# Patient Record
Sex: Female | Born: 1980
Health system: Southern US, Community
[De-identification: ages and names within clinical notes are randomized; demographics above are authoritative.]

## PROBLEM LIST (undated history)

## (undated) DIAGNOSIS — G4730 Sleep apnea, unspecified: Secondary | ICD-10-CM

## (undated) DIAGNOSIS — E119 Type 2 diabetes mellitus without complications: Secondary | ICD-10-CM

## (undated) DIAGNOSIS — F329 Major depressive disorder, single episode, unspecified: Secondary | ICD-10-CM

## (undated) DIAGNOSIS — J45909 Unspecified asthma, uncomplicated: Secondary | ICD-10-CM

## (undated) DIAGNOSIS — F319 Bipolar disorder, unspecified: Secondary | ICD-10-CM

## (undated) DIAGNOSIS — F32A Depression, unspecified: Secondary | ICD-10-CM

## (undated) DIAGNOSIS — F909 Attention-deficit hyperactivity disorder, unspecified type: Secondary | ICD-10-CM

## (undated) DIAGNOSIS — I10 Essential (primary) hypertension: Secondary | ICD-10-CM

## (undated) DIAGNOSIS — M48061 Spinal stenosis, lumbar region without neurogenic claudication: Secondary | ICD-10-CM

## (undated) DIAGNOSIS — F419 Anxiety disorder, unspecified: Secondary | ICD-10-CM

## (undated) DIAGNOSIS — G5602 Carpal tunnel syndrome, left upper limb: Secondary | ICD-10-CM

## (undated) HISTORY — PX: WISDOM TOOTH EXTRACTION: SHX21

## (undated) HISTORY — PX: LAPAROSCOPIC GASTRIC SLEEVE RESECTION: SHX5895

## (undated) HISTORY — DX: Attention-deficit hyperactivity disorder, unspecified type: F90.9

## (undated) HISTORY — DX: Essential (primary) hypertension: I10

---

## 2006-01-31 ENCOUNTER — Other Ambulatory Visit: Admission: RE | Admit: 2006-01-31 | Discharge: 2006-01-31 | Payer: Self-pay | Admitting: Obstetrics and Gynecology

## 2007-08-21 ENCOUNTER — Ambulatory Visit (HOSPITAL_COMMUNITY): Admission: RE | Admit: 2007-08-21 | Discharge: 2007-08-21 | Payer: Self-pay | Admitting: Family Medicine

## 2007-08-21 ENCOUNTER — Ambulatory Visit: Payer: Self-pay | Admitting: Vascular Surgery

## 2009-08-12 HISTORY — PX: LAPAROSCOPIC GASTRIC BANDING: SHX1100

## 2011-07-11 DIAGNOSIS — Z6841 Body Mass Index (BMI) 40.0 and over, adult: Secondary | ICD-10-CM | POA: Diagnosis present

## 2012-04-16 DIAGNOSIS — Z9884 Bariatric surgery status: Secondary | ICD-10-CM

## 2012-06-16 ENCOUNTER — Other Ambulatory Visit: Payer: Self-pay | Admitting: Obstetrics & Gynecology

## 2012-08-07 ENCOUNTER — Ambulatory Visit: Admit: 2012-08-07 | Payer: Self-pay | Admitting: Obstetrics & Gynecology

## 2012-08-07 SURGERY — LAPAROSCOPY, DIAGNOSTIC
Anesthesia: Choice

## 2013-06-15 LAB — OB RESULTS CONSOLE GC/CHLAMYDIA
Chlamydia: NEGATIVE
Gonorrhea: NEGATIVE

## 2013-06-15 LAB — OB RESULTS CONSOLE HEPATITIS B SURFACE ANTIGEN: HEP B S AG: NEGATIVE

## 2013-06-15 LAB — OB RESULTS CONSOLE HIV ANTIBODY (ROUTINE TESTING): HIV: NONREACTIVE

## 2013-06-15 LAB — OB RESULTS CONSOLE RUBELLA ANTIBODY, IGM: Rubella: IMMUNE

## 2013-06-15 LAB — OB RESULTS CONSOLE RPR: RPR: NONREACTIVE

## 2013-06-24 ENCOUNTER — Ambulatory Visit: Payer: Self-pay | Admitting: Obstetrics & Gynecology

## 2013-08-12 NOTE — L&D Delivery Note (Signed)
Delivery Note  Called to BS w pt urge to push, vtx at introitus and infant delivered w 1 push   At 8:30 PM a non-viable female was delivered via Vaginal, Spontaneous Delivery (Presentation: ;  ).  APGAR: 0, 0; weight: pending  Placenta status: intact, Spontaneous.  Cord:  with the following complications: .  Cord pH: n/a   Anesthesia: None  Episiotomy: None Lacerations: None Suture Repair: n/a Est. Blood Loss (mL): 150cc   Mom to postpartum.  [facility]  tx to 3rd floor, plan d/c tmrw  Emotional support provided Grieving appropriately    Emily HippoShelley M Yelitza [name] 12/03/2013, 9:04 PM

## 2013-11-10 ENCOUNTER — Encounter: Payer: Self-pay | Admitting: Obstetrics and Gynecology

## 2013-12-03 ENCOUNTER — Inpatient Hospital Stay (HOSPITAL_COMMUNITY): Payer: BC Managed Care – PPO | Admitting: Anesthesiology

## 2013-12-03 ENCOUNTER — Encounter (HOSPITAL_COMMUNITY): Payer: Self-pay | Admitting: *Deleted

## 2013-12-03 ENCOUNTER — Inpatient Hospital Stay (HOSPITAL_COMMUNITY)
Admission: AD | Admit: 2013-12-03 | Discharge: 2013-12-04 | DRG: 775 | Disposition: A | Discharge status: Home / Self Care | Payer: BC Managed Care – PPO | Source: Ambulatory Visit | Attending: Obstetrics and Gynecology | Admitting: Obstetrics and Gynecology

## 2013-12-03 ENCOUNTER — Inpatient Hospital Stay (HOSPITAL_COMMUNITY): Payer: BC Managed Care – PPO

## 2013-12-03 ENCOUNTER — Encounter (HOSPITAL_COMMUNITY): Payer: BC Managed Care – PPO | Admitting: Anesthesiology

## 2013-12-03 DIAGNOSIS — O99344 Other mental disorders complicating childbirth: Secondary | ICD-10-CM | POA: Diagnosis present

## 2013-12-03 DIAGNOSIS — Z8759 Personal history of other complications of pregnancy, childbirth and the puerperium: Secondary | ICD-10-CM | POA: Diagnosis present

## 2013-12-03 DIAGNOSIS — Z6841 Body Mass Index (BMI) 40.0 and over, adult: Secondary | ICD-10-CM | POA: Diagnosis present

## 2013-12-03 DIAGNOSIS — F329 Major depressive disorder, single episode, unspecified: Secondary | ICD-10-CM | POA: Diagnosis present

## 2013-12-03 DIAGNOSIS — F3289 Other specified depressive episodes: Secondary | ICD-10-CM | POA: Diagnosis present

## 2013-12-03 DIAGNOSIS — Z9884 Bariatric surgery status: Secondary | ICD-10-CM

## 2013-12-03 DIAGNOSIS — Z8659 Personal history of other mental and behavioral disorders: Secondary | ICD-10-CM

## 2013-12-03 DIAGNOSIS — O99214 Obesity complicating childbirth: Secondary | ICD-10-CM

## 2013-12-03 DIAGNOSIS — IMO0002 Reserved for concepts with insufficient information to code with codable children: Secondary | ICD-10-CM | POA: Diagnosis present

## 2013-12-03 DIAGNOSIS — O364XX0 Maternal care for intrauterine death, not applicable or unspecified: Principal | ICD-10-CM | POA: Diagnosis present

## 2013-12-03 DIAGNOSIS — E669 Obesity, unspecified: Secondary | ICD-10-CM | POA: Diagnosis present

## 2013-12-03 LAB — CBC WITH DIFFERENTIAL/PLATELET
BASOS PCT: 0 % (ref 0–1)
Basophils Absolute: 0 10*3/uL (ref 0.0–0.1)
Eosinophils Absolute: 0.2 10*3/uL (ref 0.0–0.7)
Eosinophils Relative: 1 % (ref 0–5)
HCT: 34.9 % — ABNORMAL LOW (ref 36.0–46.0)
HEMOGLOBIN: 12 g/dL (ref 12.0–15.0)
Lymphocytes Relative: 15 % (ref 12–46)
Lymphs Abs: 2.5 10*3/uL (ref 0.7–4.0)
MCH: 30.9 pg (ref 26.0–34.0)
MCHC: 34.4 g/dL (ref 30.0–36.0)
MCV: 89.9 fL (ref 78.0–100.0)
Monocytes Absolute: 1.1 10*3/uL — ABNORMAL HIGH (ref 0.1–1.0)
Monocytes Relative: 6 % (ref 3–12)
NEUTROS PCT: 78 % — AB (ref 43–77)
Neutro Abs: 13.3 10*3/uL — ABNORMAL HIGH (ref 1.7–7.7)
Platelets: 249 10*3/uL (ref 150–400)
RBC: 3.88 MIL/uL (ref 3.87–5.11)
RDW: 13 % (ref 11.5–15.5)
WBC: 17.2 10*3/uL — ABNORMAL HIGH (ref 4.0–10.5)

## 2013-12-03 LAB — RAPID URINE DRUG SCREEN, HOSP PERFORMED
Amphetamines: NOT DETECTED
BENZODIAZEPINES: NOT DETECTED
Barbiturates: NOT DETECTED
COCAINE: NOT DETECTED
Opiates: NOT DETECTED
TETRAHYDROCANNABINOL: NOT DETECTED

## 2013-12-03 LAB — COMPREHENSIVE METABOLIC PANEL
ALK PHOS: 66 U/L (ref 39–117)
ALT: 14 U/L (ref 0–35)
AST: 15 U/L (ref 0–37)
Albumin: 3.1 g/dL — ABNORMAL LOW (ref 3.5–5.2)
BUN: 8 mg/dL (ref 6–23)
CHLORIDE: 101 meq/L (ref 96–112)
CO2: 22 mEq/L (ref 19–32)
Calcium: 9.3 mg/dL (ref 8.4–10.5)
Creatinine, Ser: 0.62 mg/dL (ref 0.50–1.10)
GFR calc Af Amer: >90 mL/min (ref >90)
GFR calc non Af Amer: >90 mL/min (ref >90)
Glucose, Bld: 82 mg/dL (ref 70–99)
POTASSIUM: 3.7 meq/L (ref 3.7–5.3)
Sodium: 137 mEq/L (ref 137–147)
Total Bilirubin: 0.2 mg/dL — ABNORMAL LOW (ref 0.3–1.2)
Total Protein: 6.7 g/dL (ref 6.0–8.3)

## 2013-12-03 LAB — LACTATE DEHYDROGENASE: LDH: 158 U/L (ref 94–250)

## 2013-12-03 LAB — PROTEIN / CREATININE RATIO, URINE
Creatinine, Urine: 59.32 mg/dL
PROTEIN CREATININE RATIO: 0.09 (ref 0.00–0.15)
Total Protein, Urine: 5.3 mg/dL

## 2013-12-03 LAB — WET PREP, GENITAL
CLUE CELLS WET PREP: NONE SEEN
Trich, Wet Prep: NONE SEEN
YEAST WET PREP: NONE SEEN

## 2013-12-03 LAB — TYPE AND SCREEN
ABO/RH(D): O POS
Antibody Screen: NEGATIVE

## 2013-12-03 LAB — ABO/RH: ABO/RH(D): O POS

## 2013-12-03 LAB — URIC ACID: Uric Acid, Serum: 3.9 mg/dL (ref 2.4–7.0)

## 2013-12-03 MED ORDER — FLUOXETINE HCL 20 MG PO CAPS
40.0000 mg | ORAL_CAPSULE | Freq: Every day | ORAL | Status: DC
  Filled 2013-12-03: qty 2

## 2013-12-03 MED ORDER — OXYTOCIN 40 UNITS IN LACTATED RINGERS INFUSION - SIMPLE MED
62.5000 mL/h | INTRAVENOUS | Status: DC
  Filled 2013-12-03: qty 1000

## 2013-12-03 MED ORDER — EPHEDRINE 5 MG/ML INJ
10.0000 mg | INTRAVENOUS | Status: DC | PRN
  Filled 2013-12-03: qty 2
  Filled 2013-12-03: qty 4

## 2013-12-03 MED ORDER — BENZOCAINE-MENTHOL 20-0.5 % EX AERO: 1.0000 "application " | INHALATION_SPRAY | CUTANEOUS | Status: DC | PRN

## 2013-12-03 MED ORDER — LANOLIN HYDROUS EX OINT: TOPICAL_OINTMENT | CUTANEOUS | Status: DC | PRN

## 2013-12-03 MED ORDER — CITRIC ACID-SODIUM CITRATE 334-500 MG/5ML PO SOLN: 30.0000 mL | ORAL | Status: DC | PRN

## 2013-12-03 MED ORDER — PHENYLEPHRINE 40 MCG/ML (10ML) SYRINGE FOR IV PUSH (FOR BLOOD PRESSURE SUPPORT)
80.0000 ug | PREFILLED_SYRINGE | INTRAVENOUS | Status: DC | PRN
  Filled 2013-12-03: qty 2

## 2013-12-03 MED ORDER — LACTATED RINGERS IV SOLN: 500.0000 mL | Freq: Once | INTRAVENOUS | Status: DC

## 2013-12-03 MED ORDER — BUTORPHANOL TARTRATE 1 MG/ML IJ SOLN
2.0000 mg | INTRAMUSCULAR | Status: DC | PRN
  Administered 2013-12-03 (×2): 2 mg via INTRAVENOUS
  Filled 2013-12-03 (×2): qty 2

## 2013-12-03 MED ORDER — MISOPROSTOL 200 MCG PO TABS
50.0000 ug | ORAL_TABLET | ORAL | Status: DC | PRN
  Administered 2013-12-03: 50 ug via VAGINAL
  Filled 2013-12-03 (×2): qty 0.5

## 2013-12-03 MED ORDER — IBUPROFEN 600 MG PO TABS
600.0000 mg | ORAL_TABLET | Freq: Four times a day (QID) | ORAL | Status: DC
  Administered 2013-12-04 (×3): 600 mg via ORAL
  Filled 2013-12-03 (×3): qty 1

## 2013-12-03 MED ORDER — MEDROXYPROGESTERONE ACETATE 150 MG/ML IM SUSP: 150.0000 mg | INTRAMUSCULAR | Status: DC | PRN

## 2013-12-03 MED ORDER — EPHEDRINE 5 MG/ML INJ
10.0000 mg | INTRAVENOUS | Status: DC | PRN
  Filled 2013-12-03: qty 2

## 2013-12-03 MED ORDER — LACTATED RINGERS IV SOLN
INTRAVENOUS | Status: DC
  Administered 2013-12-03: 13:00:00 via INTRAVENOUS

## 2013-12-03 MED ORDER — ZOLPIDEM TARTRATE 5 MG PO TABS: 5.0000 mg | ORAL_TABLET | Freq: Every evening | ORAL | Status: DC | PRN

## 2013-12-03 MED ORDER — OXYTOCIN BOLUS FROM INFUSION
500.0000 mL | INTRAVENOUS | Status: DC
  Administered 2013-12-03: 500 mL via INTRAVENOUS

## 2013-12-03 MED ORDER — BUTORPHANOL TARTRATE 1 MG/ML IJ SOLN: 1.0000 mg | INTRAMUSCULAR | Status: DC | PRN

## 2013-12-03 MED ORDER — PHENYLEPHRINE 40 MCG/ML (10ML) SYRINGE FOR IV PUSH (FOR BLOOD PRESSURE SUPPORT)
80.0000 ug | PREFILLED_SYRINGE | INTRAVENOUS | Status: DC | PRN
  Filled 2013-12-03: qty 10
  Filled 2013-12-03: qty 2

## 2013-12-03 MED ORDER — LIDOCAINE HCL (PF) 1 % IJ SOLN
30.0000 mL | INTRAMUSCULAR | Status: DC | PRN
  Filled 2013-12-03: qty 30

## 2013-12-03 MED ORDER — IBUPROFEN 600 MG PO TABS: 600.0000 mg | ORAL_TABLET | Freq: Four times a day (QID) | ORAL | Status: DC | PRN

## 2013-12-03 MED ORDER — DIPHENHYDRAMINE HCL 25 MG PO CAPS: 25.0000 mg | ORAL_CAPSULE | Freq: Four times a day (QID) | ORAL | Status: DC | PRN

## 2013-12-03 MED ORDER — SENNOSIDES-DOCUSATE SODIUM 8.6-50 MG PO TABS
2.0000 | ORAL_TABLET | ORAL | Status: DC
  Administered 2013-12-04: 2 via ORAL
  Filled 2013-12-03: qty 2

## 2013-12-03 MED ORDER — MEASLES, MUMPS & RUBELLA VAC ~~LOC~~ INJ
0.5000 mL | INJECTION | Freq: Once | SUBCUTANEOUS | Status: DC
  Filled 2013-12-03: qty 0.5

## 2013-12-03 MED ORDER — ACETAMINOPHEN 325 MG PO TABS: 650.0000 mg | ORAL_TABLET | ORAL | Status: DC | PRN

## 2013-12-03 MED ORDER — PRENATAL MULTIVITAMIN CH: 1.0000 | ORAL_TABLET | Freq: Every day | ORAL | Status: DC

## 2013-12-03 MED ORDER — BUTORPHANOL TARTRATE 1 MG/ML IJ SOLN
INTRAMUSCULAR | Status: AC
  Filled 2013-12-03: qty 2

## 2013-12-03 MED ORDER — TETANUS-DIPHTH-ACELL PERTUSSIS 5-2.5-18.5 LF-MCG/0.5 IM SUSP: 0.5000 mL | Freq: Once | INTRAMUSCULAR | Status: DC

## 2013-12-03 MED ORDER — ONDANSETRON HCL 4 MG/2ML IJ SOLN
4.0000 mg | Freq: Four times a day (QID) | INTRAMUSCULAR | Status: DC | PRN
Start: 2013-12-03 — End: 2013-12-03

## 2013-12-03 MED ORDER — OXYCODONE-ACETAMINOPHEN 5-325 MG PO TABS: 1.0000 | ORAL_TABLET | ORAL | Status: DC | PRN

## 2013-12-03 MED ORDER — DIBUCAINE 1 % RE OINT: 1.0000 "application " | TOPICAL_OINTMENT | RECTAL | Status: DC | PRN

## 2013-12-03 MED ORDER — SIMETHICONE 80 MG PO CHEW: 80.0000 mg | CHEWABLE_TABLET | ORAL | Status: DC | PRN

## 2013-12-03 MED ORDER — ONDANSETRON HCL 4 MG/2ML IJ SOLN: 4.0000 mg | INTRAMUSCULAR | Status: DC | PRN

## 2013-12-03 MED ORDER — FLEET ENEMA 7-19 GM/118ML RE ENEM: 1.0000 | ENEMA | Freq: Every day | RECTAL | Status: DC | PRN

## 2013-12-03 MED ORDER — FENTANYL 2.5 MCG/ML BUPIVACAINE 1/10 % EPIDURAL INFUSION (WH - ANES)
14.0000 mL/h | INTRAMUSCULAR | Status: DC | PRN
  Filled 2013-12-03: qty 125

## 2013-12-03 MED ORDER — DIPHENHYDRAMINE HCL 50 MG/ML IJ SOLN: 12.5000 mg | INTRAMUSCULAR | Status: DC | PRN

## 2013-12-03 MED ORDER — LACTATED RINGERS IV SOLN: 500.0000 mL | INTRAVENOUS | Status: DC | PRN

## 2013-12-03 MED ORDER — TERBUTALINE SULFATE 1 MG/ML IJ SOLN: 0.2500 mg | Freq: Once | INTRAMUSCULAR | Status: DC | PRN

## 2013-12-03 MED ORDER — BISACODYL 10 MG RE SUPP: 10.0000 mg | Freq: Every day | RECTAL | Status: DC | PRN

## 2013-12-03 MED ORDER — WITCH HAZEL-GLYCERIN EX PADS: 1.0000 "application " | MEDICATED_PAD | CUTANEOUS | Status: DC | PRN

## 2013-12-03 MED ORDER — BUPROPION HCL ER (XL) 300 MG PO TB24
450.0000 mg | ORAL_TABLET | Freq: Every day | ORAL | Status: DC
  Filled 2013-12-03: qty 1

## 2013-12-03 MED ORDER — ONDANSETRON HCL 4 MG PO TABS: 4.0000 mg | ORAL_TABLET | ORAL | Status: DC | PRN

## 2013-12-03 NOTE — MAU Note (Signed)
Bedside US in progress

## 2013-12-03 NOTE — Progress Notes (Signed)
12/03/13 1700  Clinical Encounter Type  Visited With Patient and family together (mom [name])  Visit Type Follow-up;Spiritual support;Social support  Referral From Chaplain  Spiritual Encounters  Spiritual Needs Emotional;Grief support   Followed up for continued spiritual and emotional support.  [name] and her mom report that they have a very supportive relationship and note that they have processed a number of family losses together.  They are at the beginning end of trying to absorb and process the unexpected death of pt's baby.  Per pt, she has good support from her psychiatrist, with whom she has an appointment set up for 6pm on Tuesday so that bereavement support is already in place.  Family is aware of ongoing chaplain availability, but please page as needs arise.  Thank you.  [address]Chaplain Lurlene Ronda [place], [place] [phone]

## 2013-12-03 NOTE — H&P (Signed)
[name] is a 33 y.o. female, G1P0 at 30.0 weeks, presenting for decreased fetal movement since last night and cramping.  Denies VB, UCs, recent fever, resp or GI c/o's, UTI or PIH s/s.   Patient Active Problem List   Diagnosis Date Noted  . IUFD (intrauterine fetal death) 12/03/2013  . Severe obesity (BMI >= 40) 12/03/2013  . Hx of laparoscopic gastric banding 12/03/2013  . H/O: depression 12/03/2013    History of present pregnancy: Patient entered care at 6 weeks.   EDC of 02/11/2014 was established by LMP   Anatomy scan:  22 weeks, limited with normal findings and an posterior placenta.  Anatomy scan completed at 26 weeks Additional US evaluations:  none.   Significant prenatal events:  none   Last evaluation:  11/26/2013 at [redacted] weeks GA  OB History   Grav Para Term Preterm Abortions TAB SAB Ect Mult Living   1              Past Medical History  Diagnosis Date  . Obesity    Past Surgical History  Procedure Laterality Date  . Laparoscopic gastric banding     Family History: family history is not on file. Social History:  reports that she has never smoked. She does not have any smokeless tobacco history on file. She reports that she does not drink alcohol or use illicit drugs.   ROS: see HPI above, all other systems are negative   No Known Allergies   Blood pressure 131/76, pulse 106, temperature 98.5 F (36.9 C), temperature source Oral, resp. rate 20, height 5\' 9"  (1.753 m), weight 307 lb 12.8 oz (139.617 kg).  Chest clear Heart RRR without murmur Abd gravid, NT Pelvic: 1-2 cm / 50 / -2 Ext: wnl   Prenatal labs: ABO, Rh:  O+ Antibody:   neg Rubella:   Immune RPR:   Neg HBsAg:   Neg HIV:   Neg GBS:  not collected Sickle cell/Hgb electrophoresis:  Normal study Pap:  Normal 07/20/14 GC:  Neg Chlamydia:  Neg Genetic screenings:  1st trimester and Quad normal Glucola:  126 Other:  TSH 2015 normal      Assessment IUFD at 7552w0d Obesity Labs  collected Hx of anxiety, Depression and ADHD   Plan: Admit to L&D per consult with Dr Su Hiltoberts Cyotoec 50mg  PV q 4 x 4 doses Provide supportive care. Chaplin to come PRN   Gray BernhardtJennifer OxleyCNM, MSN 12/03/2013, 1:22 PM

## 2013-12-03 NOTE — Progress Notes (Signed)
Patient ID: [name], female   DOB: 27-Jan-1981, 33 y.o.   MRN: [id number] [name] is a 33 y.o. G1P0 at 5186w0d admitted for IUFD  Subjective: Breathing w ctx, now requests epidural, just now SROM sm amt fluid, pt's mom supportive at bs, grieving appropriately   Objective: BP 140/70  Pulse 103  Temp(Src) 98.7 F (37.1 C) (Oral)  Resp 20  Ht 5\' 9"  (1.753 m)  Wt 307 lb 12.8 oz (139.617 kg)  BMI 45.43 kg/m2     FHT:  N/a  UC:   toco 2-3   SVE:   Dilation: 3 Effacement (%): Thick Station: -3 Exam by:: lee  ve deferred at present   Results for orders placed during the hospital encounter of 12/03/13 (from the past 24 hour(s))  PROTEIN / CREATININE RATIO, URINE     Status: None   Collection Time    12/03/13 10:55 AM      Result Value Ref Range   Creatinine, Urine 59.32     Total Protein, Urine 5.3     PROTEIN CREATININE RATIO 0.09  0.00 - 0.15  URINE RAPID DRUG SCREEN (HOSP PERFORMED)     Status: None   Collection Time    12/03/13 10:55 AM      Result Value Ref Range   Opiates NONE DETECTED  NONE DETECTED   Cocaine NONE DETECTED  NONE DETECTED   Benzodiazepines NONE DETECTED  NONE DETECTED   Amphetamines NONE DETECTED  NONE DETECTED   Tetrahydrocannabinol NONE DETECTED  NONE DETECTED   Barbiturates NONE DETECTED  NONE DETECTED  CBC WITH DIFFERENTIAL     Status: Abnormal   Collection Time    12/03/13  1:10 PM      Result Value Ref Range   WBC 17.2 (*) 4.0 - 10.5 K/uL   RBC 3.88  3.87 - 5.11 MIL/uL   Hemoglobin 12.0  12.0 - 15.0 g/dL   HCT 40.934.9 (*) 81.136.0 - 91.446.0 %   MCV 89.9  78.0 - 100.0 fL   MCH 30.9  26.0 - 34.0 pg   MCHC 34.4  30.0 - 36.0 g/dL   RDW 78.213.0  95.611.5 - 21.315.5 %   Platelets 249  150 - 400 K/uL   Neutrophils Relative % 78 (*) 43 - 77 %   Neutro Abs 13.3 (*) 1.7 - 7.7 K/uL   Lymphocytes Relative 15  12 - 46 %   Lymphs Abs 2.5  0.7 - 4.0 K/uL   Monocytes Relative 6  3 - 12 %   Monocytes Absolute 1.1 (*) 0.1 - 1.0 K/uL   Eosinophils Relative 1  0 - 5  %   Eosinophils Absolute 0.2  0.0 - 0.7 K/uL   Basophils Relative 0  0 - 1 %   Basophils Absolute 0.0  0.0 - 0.1 K/uL  COMPREHENSIVE METABOLIC PANEL     Status: Abnormal   Collection Time    12/03/13  1:10 PM      Result Value Ref Range   Sodium 137  137 - 147 mEq/L   Potassium 3.7  3.7 - 5.3 mEq/L   Chloride 101  96 - 112 mEq/L   CO2 22  19 - 32 mEq/L   Glucose, Bld 82  70 - 99 mg/dL   BUN 8  6 - 23 mg/dL   Creatinine, Ser 0.860.62  0.50 - 1.10 mg/dL   Calcium 9.3  8.4 - 57.810.5 mg/dL   Total Protein 6.7  6.0 - 8.3 g/dL  Albumin 3.1 (*) 3.5 - 5.2 g/dL   AST 15  0 - 37 U/L   ALT 14  0 - 35 U/L   Alkaline Phosphatase 66  39 - 117 U/L   Total Bilirubin 0.2 (*) 0.3 - 1.2 mg/dL   GFR calc non Af Amer >90  >90 mL/min   GFR calc Af Amer >90  >90 mL/min  URIC ACID     Status: None   Collection Time    12/03/13  1:10 PM      Result Value Ref Range   Uric Acid, Serum 3.9  2.4 - 7.0 mg/dL  LACTATE DEHYDROGENASE     Status: None   Collection Time    12/03/13  1:10 PM      Result Value Ref Range   LDH 158  94 - 250 U/L  ABO/RH     Status: None   Collection Time    12/03/13  1:10 PM      Result Value Ref Range   ABO/RH(D) O POS    TYPE AND SCREEN     Status: None   Collection Time    12/03/13  3:00 PM      Result Value Ref Range   ABO/RH(D) O POS     Antibody Screen NEG     Sample Expiration 12/06/2013    WET PREP, GENITAL     Status: Abnormal   Collection Time    12/03/13  3:23 PM      Result Value Ref Range   Yeast Wet Prep HPF POC NONE SEEN  NONE SEEN   Trich, Wet Prep NONE SEEN  NONE SEEN   Clue Cells Wet Prep HPF POC NONE SEEN  NONE SEEN   WBC, Wet Prep HPF POC FEW (*) NONE SEEN     Assessment / Plan:  Labor: Progressing normally Preeclampsia:  no s/s, all PIH labs normal  Fetal Wellbeing:  n/a Pain Control:  ready for epidural  Anticipated MOD:  NSVD  IOL for IUFD at 30wks Leukocytosis, afebrile Progressing after rcv'd 2 dose cytotec,  Will recheck after  comfortable, augment pitocin PRN  Emotional support    Update physician PRN   Malissa HippoShelley M Alizandra Loh 12/03/2013, 8:13 PM

## 2013-12-03 NOTE — Progress Notes (Signed)
  Subjective: Pt reports more intensity with cramping.  Family at bedside and supportive.  Objective: BP 113/56  Pulse 102  Temp(Src) 98.4 F (36.9 C) (Oral)  Resp 18  Ht 5\' 9"  (1.753 m)  Wt 307 lb 12.8 oz (139.617 kg)  BMI 45.43 kg/m2      UC:   cramping  SVE:   Dilation: 1.5 Effacement (%): 50 Station: -2 Exam by:: Vonne Mcdanel, cnm   Induction of labor due to fetal demise,  progressing well on pitocin  Labor: Dose of Cytotec placed at 1514;  Will re-evaluate POC at 1914  Preeclampsia: BP WNL  Fetal Wellbeing: n/a  Pain Control: Labor support without medications  I/D: n/a  Anticipated MOD: NSVD    Melady Chow 11/13/2013, 7:23 AM

## 2013-12-03 NOTE — MAU Note (Signed)
Lower abd cramping since last night, denies bleeding or LOF.  No FM noted during the night.

## 2013-12-03 NOTE — MAU Note (Signed)
Unable to find FHR with EFM or doppler, CNM informed, U/S to be ordered.

## 2013-12-03 NOTE — Anesthesia Preprocedure Evaluation (Deleted)
Anesthesia Evaluation  Patient identified by MRN, date of birth, ID band Patient awake    Reviewed: Allergy & Precautions, H&P , Patient's Chart, lab work & pertinent test results  Airway       Dental   Pulmonary          Cardiovascular negative cardio ROS      Neuro/Psych    GI/Hepatic negative GI ROS, Neg liver ROS,   Endo/Other  Morbid obesity  Renal/GU negative Renal ROS     Musculoskeletal   Abdominal   Peds  Hematology negative hematology ROS (+)   Anesthesia Other Findings   Reproductive/Obstetrics IUFD                          Anesthesia Physical Anesthesia Plan  ASA: III  Anesthesia Plan: Epidural   Post-op Pain Management:    Induction:   Airway Management Planned:   Additional Equipment:   Intra-op Plan:   Post-operative Plan:   Informed Consent: I have reviewed the patients History and Physical, chart, labs and discussed the procedure including the risks, benefits and alternatives for the proposed anesthesia with the patient or authorized representative who has indicated his/her understanding and acceptance.     Plan Discussed with:   Anesthesia Plan Comments:         Anesthesia Quick Evaluation

## 2013-12-03 NOTE — MAU Note (Signed)
J. Oxley CNM in discussing U/S results with pt & her family member - no cardiac activity seen on U/S.

## 2013-12-03 NOTE — Progress Notes (Signed)
Ms Barry DienesOwens is still very much in shock.  She has her mother and a close friend in the room with her.  They are still processing and she is not really able to take in much information about what to expect through the delivery and in the time after delivery.  She named her baby Felicity Pellegrinied after her father who died in 2012.  FOB is not involved at this time.    Chaplains are available for support 24 hours/day.  Please page for support.  [employer] CorporationChaplain Katy Gowri Suchan Pager, [phone] 1:40 PM   12/03/13 1300  Clinical Encounter Type  Visited With Patient and family together  Visit Type Spiritual support  Referral From Nurse  Consult/Referral To Chaplain  Spiritual Encounters  Spiritual Needs Grief support;Emotional  Stress Factors  Patient Stress Factors Loss

## 2013-12-04 LAB — CBC
HCT: 32.3 % — ABNORMAL LOW (ref 36.0–46.0)
Hemoglobin: 11.1 g/dL — ABNORMAL LOW (ref 12.0–15.0)
MCH: 31.1 pg (ref 26.0–34.0)
MCHC: 34.4 g/dL (ref 30.0–36.0)
MCV: 90.5 fL (ref 78.0–100.0)
PLATELETS: 262 10*3/uL (ref 150–400)
RBC: 3.57 MIL/uL — ABNORMAL LOW (ref 3.87–5.11)
RDW: 12.9 % (ref 11.5–15.5)
WBC: 22.1 10*3/uL — ABNORMAL HIGH (ref 4.0–10.5)

## 2013-12-04 LAB — GC/CHLAMYDIA PROBE AMP
CT PROBE, AMP APTIMA: NEGATIVE
GC Probe RNA: NEGATIVE

## 2013-12-04 MED ORDER — IBUPROFEN 600 MG PO TABS: 600.0000 mg | ORAL_TABLET | Freq: Four times a day (QID) | ORAL | Status: DC

## 2013-12-04 NOTE — Discharge Summary (Signed)
Vaginal Delivery Discharge Summary  [name]  DOB:    1981-04-17 MRN:    [id number] CSN:    [id number]  Date of admission:                  December 05, 2013  Date of discharge:                   12/04/13  Procedures this admission: IOL for IUFD at 30 weeks  Date of Delivery: Dec 05, 2013 by Sanda Klein  Newborn Data:  Non- viable female  Birth Weight: 3 lb 7.7 oz (1580 g) APGAR: 0, 0  Home with [facility], autopsy per pt request. Name: "Ted" Circumcision Plan: n/a  History of Present Illness:  Ms. [name] is a 33 y.o. female, G1P0100, who presents at [redacted]w[redacted]d weeks gestation. The patient has been followed at the [facility] and Gynecology division of [facility]. She was admitted induction of labor for IUFD. Her pregnancy has been complicated by:   Patient Active Problem List   Diagnosis Date Noted  . IUFD (intrauterine fetal death) 12/05/2013  . Severe obesity (BMI >= 40) 12-05-2013  . Hx of laparoscopic gastric banding December 05, 2013  . H/O: depression 12/05/2013     Hospital course:  The patient was admitted for IOL for IUFD.   Her labor was not complicated. She proceeded to have a vaginal delivery of a non-viable infant. Her delivery was not complicated. Her postpartum course was not complicated.  She was discharged to home on postpartum day 1 grieving appropriately  Feeding:  n/a  Contraception:  unable to discuss at this time  Discharge hemoglobin:  Hemoglobin  Date Value Ref Range Status  12/04/2013 11.1* 12.0 - 15.0 g/dL Final     HCT  Date Value Ref Range Status  12/04/2013 32.3* 36.0 - 46.0 % Final    Discharge Physical Exam:   General: alert, cooperative and tearful Lochia: appropriate Uterine Fundus: firm Incision: n/a DVT Evaluation: No evidence of DVT seen on physical exam. Negative Homan's sign.  Intrapartum Procedures: spontaneous vaginal delivery of a non-viable 30 week fetus Postpartum Procedures:  none Complications-Operative and Postpartum: none  Discharge Diagnoses: Spontaneous abortion  Discharge Information:  Activity:           pelvic rest Diet:                routine Medications: PNV and Ibuprofen Condition:      stable Instructions:   Postpartum Care After Vaginal Delivery  After you deliver your newborn (postpartum period), the usual stay in the hospital is 24 72 hours. If there were problems with your labor or delivery, or if you have other medical problems, you might be in the hospital longer.  While you are in the hospital, you will receive help and instructions on how to care for yourself and your newborn during the postpartum period.  While you are in the hospital:  Be sure to tell your nurses if you have pain or discomfort, as well as where you feel the pain and what makes the pain worse.  If you had an incision made near your vagina (episiotomy) or if you had some tearing during delivery, the nurses may put ice packs on your episiotomy or tear. The ice packs may help to reduce the pain and swelling.  If you are breastfeeding, you may feel uncomfortable contractions of your uterus for a couple of weeks. This is normal. The contractions help your uterus get back  to normal size.  It is normal to have some bleeding after delivery.  For the first 1 3 days after delivery, the flow is red and the amount may be similar to a period.  It is common for the flow to start and stop.  In the first few days, you may pass some small clots. Let your nurses know if you begin to pass large clots or your flow increases.  Do not  flush blood clots down the toilet before having the nurse look at them.  During the next 3 10 days after delivery, your flow should become more watery and pink or brown-tinged in color.  Ten to fourteen days after delivery, your flow should be a small amount of yellowish-white discharge.  The amount of your flow will decrease over the first few weeks  after delivery. Your flow may stop in 6 8 weeks. Most women have had their flow stop by 12 weeks after delivery.  You should change your sanitary pads frequently.  Wash your hands thoroughly with soap and water for at least 20 seconds after changing pads, using the toilet, or before holding or feeding your newborn.  You should feel like you need to empty your bladder within the first 6 8 hours after delivery.  In case you become weak, lightheaded, or faint, call your nurse before you get out of bed for the first time and before you take a shower for the first time.  Within the first few days after delivery, your breasts may begin to feel tender and full. This is called engorgement. Breast tenderness usually goes away within 48 72 hours after engorgement occurs. You may also notice milk leaking from your breasts. If you are not breastfeeding, do not stimulate your breasts. Breast stimulation can make your breasts produce more milk.  Spending as much time as possible with your newborn is very important. During this time, you and your newborn can feel close and get to know each other. Having your newborn stay in your room (rooming in) will help to strengthen the bond with your newborn. It will give you time to get to know your newborn and become comfortable caring for your newborn.  Your hormones change after delivery. Sometimes the hormone changes can temporarily cause you to feel sad or tearful. These feelings should not last more than a few days. If these feelings last longer than that, you should talk to your caregiver.  If desired, talk to your caregiver about methods of family planning or contraception.  Talk to your caregiver about immunizations. Your caregiver may want you to have the following immunizations before leaving the hospital:  Tetanus, diphtheria, and pertussis (Tdap) or tetanus and diphtheria (Td) immunization. It is very important that you and your family (including  grandparents) or others caring for your newborn are up-to-date with the Tdap or Td immunizations. The Tdap or Td immunization can help protect your newborn from getting ill.  Rubella immunization.  Varicella (chickenpox) immunization.  Influenza immunization. You should receive this annual immunization if you did not receive the immunization during your pregnancy. Document Released: 05/26/2007 Document Revised: 04/22/2012 Document Reviewed: 03/25/2012 [facility] Patient Information 2014 [place].   Postpartum Depression and Baby Blues  The postpartum period begins right after the birth of a baby. During this time, there is often a great amount of joy and excitement. It is also a time of considerable changes in the life of the parent(s). Regardless of how many times a mother gives birth,  each child brings new challenges and dynamics to the family. It is not unusual to have feelings of excitement accompanied by confusing shifts in moods, emotions, and thoughts. All mothers are at risk of developing postpartum depression or the "baby blues." These mood changes can occur right after giving birth, or they may occur many months after giving birth. The baby blues or postpartum depression can be mild or severe. Additionally, postpartum depression can resolve rather quickly, or it can be a long-term condition. CAUSES Elevated hormones and their rapid decline are thought to be a main cause of postpartum depression and the baby blues. There are a number of hormones that radically change during and after pregnancy. Estrogen and progesterone usually decrease immediately after delivering your baby. The level of thyroid hormone and various cortisol steroids also rapidly drop. Other factors that play a major role in these changes include major life events and genetics.  RISK FACTORS If you have any of the following risks for the baby blues or postpartum depression, know what symptoms to watch out for during  the postpartum period. Risk factors that may increase the likelihood of getting the baby blues or postpartum depression include:  Havinga personal or family history of depression.  Having depression while being pregnant.  Having premenstrual or oral contraceptive-associated mood issues.  Having exceptional life stress.  Having marital conflict.  Lacking a social support network.  Having a baby with special needs.  Having health problems such as diabetes. SYMPTOMS Baby blues symptoms include:  Brief fluctuations in mood, such as going from extreme happiness to sadness.  Decreased concentration.  Difficulty sleeping.  Crying spells, tearfulness.  Irritability.  Anxiety. Postpartum depression symptoms typically begin within the first month after giving birth. These symptoms include:  Difficulty sleeping or excessive sleepiness.  Marked weight loss.  Agitation.  Feelings of worthlessness.  Lack of interest in activity or food. Postpartum psychosis is a very concerning condition and can be dangerous. Fortunately, it is rare. Displaying any of the following symptoms is cause for immediate medical attention. Postpartum psychosis symptoms include:  Hallucinations and delusions.  Bizarre or disorganized behavior.  Confusion or disorientation. DIAGNOSIS  A diagnosis is made by an evaluation of your symptoms. There are no medical or lab tests that lead to a diagnosis, but there are various questionnaires that a caregiver may use to identify those with the baby blues, postpartum depression, or psychosis. Often times, a screening tool called the [place] Postnatal Depression Scale is used to diagnose depression in the postpartum period.  TREATMENT The baby blues usually goes away on its own in 1 to 2 weeks. Social support is often all that is needed. You should be encouraged to get adequate sleep and rest. Occasionally, you may be given medicines to help you sleep.   Postpartum depression requires treatment as it can last several months or longer if it is not treated. Treatment may include individual or group therapy, medicine, or both to address any social, physiological, and psychological factors that may play a role in the depression. Regular exercise, a healthy diet, rest, and social support may also be strongly recommended.  Postpartum psychosis is more serious and needs treatment right away. Hospitalization is often needed. HOME CARE INSTRUCTIONS  Get as much rest as you can. Nap when the baby sleeps.  Exercise regularly. Some women find yoga and walking to be beneficial.  Eat a balanced and nourishing diet.  Do little things that you enjoy. Have a cup of tea, take a  bubble bath, read your favorite magazine, or listen to your favorite music.  Avoid alcohol.  Ask for help with household chores, cooking, grocery shopping, or running errands as needed. Do not try to do everything.  Talk to people close to you about how you are feeling. Get support from your partner, family members, friends, or other new moms.  Try to stay positive in how you think. Think about the things you are grateful for.  Do not spend a lot of time alone.  Only take medicine as directed by your caregiver.  Keep all your postpartum appointments.  Let your caregiver know if you have any concerns. SEEK MEDICAL CARE IF: You are having a reaction or problems with your medicine. SEEK IMMEDIATE MEDICAL CARE IF:  You have suicidal feelings.  You feel you may harm the baby or someone else. Document Released: 05/02/2004 Document Revised: 10/21/2011 Document Reviewed: 06/04/2011 [facility] Patient Information 2014 [place], [place].   Discharge to: home  Follow-up Information   Follow up with [facility]. Schedule an appointment as soon as possible for a visit in 2 weeks. (Call with any questions or concerns.)    Specialty:  Obstetrics and  Gynecology   Contact information:   [address] [place] [address] [phone]       Haroldine Laws 12/04/2013

## 2013-12-04 NOTE — Discharge Instructions (Signed)
Postpartum Care After Vaginal Delivery After you deliver your newborn (postpartum period), the usual stay in the hospital is 24 72 hours. If there were problems with your labor or delivery, or if you have other medical problems, you might be in the hospital longer.  While you are in the hospital, you will receive help and instructions on how to care for yourself and your newborn during the postpartum period.  While you are in the hospital:  Be sure to tell your nurses if you have pain or discomfort, as well as where you feel the pain and what makes the pain worse.  If you had an incision made near your vagina (episiotomy) or if you had some tearing during delivery, the nurses may put ice packs on your episiotomy or tear. The ice packs may help to reduce the pain and swelling.  It is normal to have some bleeding after delivery.  For the first 1 3 days after delivery, the flow is red and the amount may be similar to a period.  It is common for the flow to start and stop.  In the first few days, you may pass some small clots. Let your nurses know if you begin to pass large clots or your flow increases.  Do not  flush blood clots down the toilet before having the nurse look at them.  During the next 3 10 days after delivery, your flow should become more watery and pink or brown-tinged in color.  Ten to fourteen days after delivery, your flow should be a small amount of yellowish-white discharge.  The amount of your flow will decrease over the first few weeks after delivery. Your flow may stop in 6 8 weeks. Most women have had their flow stop by 12 weeks after delivery.  You should change your sanitary pads frequently.  Wash your hands thoroughly with soap and water for at least 20 seconds after changing pads, using the toilet, or before holding or feeding your newborn.  You should feel like you need to empty your bladder within the first 6 8 hours after delivery.  In case you become weak,  lightheaded, or faint, call your nurse before you get out of bed for the first time and before you take a shower for the first time.  Within the first few days after delivery, your breasts may begin to feel tender and full. This is called engorgement. Breast tenderness usually goes away within 48 72 hours after engorgement occurs. You may also notice milk leaking from your breasts. Do not stimulate your breasts. Breast stimulation can make your breasts produce more milk.  Your hormones change after delivery. Sometimes the hormone changes can temporarily cause you to feel sad or tearful. These feelings should not last more than a few days. If these feelings last longer than that, you should talk to your caregiver.  If desired, talk to your caregiver about methods of family planning or contraception.  Talk to your caregiver about immunizations. Your caregiver may want you to have the following immunizations before leaving the hospital:  Rubella immunization.  Varicella (chickenpox) immunization.  Influenza immunization. You should receive this annual immunization if you did not receive the immunization during your pregnancy. Document Released: 05/26/2007 Document Revised: 04/22/2012 Document Reviewed: 03/25/2012 [facility] Patient Information 2014 [place], [place].  Postpartum Depression and Baby Blues The postpartum period begins right after the birth of a baby. It is not unusual to have feelings of confusing shifts in moods, emotions, and thoughts. All  mothers are at risk of developing postpartum depression or the "baby blues." These mood changes can occur right after giving birth, or they may occur many months after giving birth. The baby blues or postpartum depression can be mild or severe. Additionally, postpartum depression can resolve rather quickly, or it can be a long-term condition. CAUSES Elevated hormones and their rapid decline are thought to be a main cause of postpartum depression and  the baby blues. There are a number of hormones that radically change during and after pregnancy. Estrogen and progesterone usually decrease immediately after delivering your baby. The level of thyroid hormone and various cortisol steroids also rapidly drop. Other factors that play a major role in these changes include major life events and genetics.  RISK FACTORS If you have any of the following risks for the baby blues or postpartum depression, know what symptoms to watch out for during the postpartum period. Risk factors that may increase the likelihood of getting the baby blues or postpartum depression include:  Havinga personal or family history of depression.  Having depression while being pregnant.  Having premenstrual or oral contraceptive-associated mood issues.  Having exceptional life stress.  Having marital conflict.  Lacking a social support network.  Having a baby with special needs.  Having health problems such as diabetes. SYMPTOMS Baby blues symptoms include:  Brief fluctuations in mood, such as going from extreme happiness to sadness.  Decreased concentration.  Difficulty sleeping.  Crying spells, tearfulness.  Irritability.  Anxiety. Postpartum depression symptoms typically begin within the first month after giving birth. These symptoms include:  Difficulty sleeping or excessive sleepiness.  Marked weight loss.  Agitation.  Feelings of worthlessness.  Lack of interest in activity or food. Postpartum psychosis is a very concerning condition and can be dangerous. Fortunately, it is rare. Displaying any of the following symptoms is cause for immediate medical attention. Postpartum psychosis symptoms include:  Hallucinations and delusions.  Bizarre or disorganized behavior.  Confusion or disorientation. DIAGNOSIS  A diagnosis is made by an evaluation of your symptoms. There are no medical or lab tests that lead to a diagnosis, but there are various  questionnaires that a caregiver may use to identify those with the baby blues, postpartum depression, or psychosis. Often times, a screening tool called the [place] Postnatal Depression Scale is used to diagnose depression in the postpartum period.  TREATMENT The baby blues usually goes away on its own in 1 to 2 weeks. Social support is often all that is needed. You should be encouraged to get adequate sleep and rest. Occasionally, you may be given medicines to help you sleep.  Postpartum depression requires treatment as it can last several months or longer if it is not treated. Treatment may include individual or group therapy, medicine, or both to address any social, physiological, and psychological factors that may play a role in the depression. Regular exercise, a healthy diet, rest, and social support may also be strongly recommended.  Postpartum psychosis is more serious and needs treatment right away. Hospitalization is often needed. HOME CARE INSTRUCTIONS  Get as much rest as you can.   Exercise regularly. Some women find yoga and walking to be beneficial.  Eat a balanced and nourishing diet.  Do little things that you enjoy. Have a cup of tea, take a bubble bath, read your favorite magazine, or listen to your favorite music.  Avoid alcohol.  Ask for help with household chores, cooking, grocery shopping, or running errands as needed. Do not try  to do everything.  Talk to people close to you about how you are feeling. Get support from your partner, family members, or friends.  Try to stay positive in how you think. Think about the things you are grateful for.  Do not spend a lot of time alone.  Only take medicine as directed by your caregiver.  Keep all your postpartum appointments.  Let your caregiver know if you have any concerns. SEEK MEDICAL CARE IF: You are having a reaction or problems with your medicine. SEEK IMMEDIATE MEDICAL CARE IF:  You have suicidal  feelings.  You feel you may harm yourself or someone else. Document Released: 05/02/2004 Document Revised: 10/21/2011 Document Reviewed: 05/10/2013 [facility] Patient Information 2014 [place], [place].  Pelvic Rest for 2 weeks HOME CARE INSTRUCTIONS  Do not have sexual intercourse, stimulation, or an orgasm.  Do not use tampons, douche, or put anything in the vagina.  Do not lift anything over 10 pounds (4.5 kg).  Avoid strenuous activity or straining your pelvic muscles. SEEK MEDICAL CARE IF:  You have any vaginal bleeding during pregnancy. Treat this as a potential emergency.  You have cramping pain felt low in the stomach (stronger than menstrual cramps).  You notice vaginal discharge (watery, mucus, or bloody).  You have a low, dull backache.  There are regular contractions or uterine tightening. SEEK IMMEDIATE MEDICAL CARE IF: You have vaginal bleeding and have placenta previa.  Document Released: 11/23/2010 Document Revised: 10/21/2011 Document Reviewed: 11/23/2010 [facility] Patient Information 2014 [place], [place].

## 2013-12-04 NOTE — Progress Notes (Signed)
Discharge instructions provided to patient at bedside.  Activity, medications, follow up appointments, when to call the doctor and community resources discussed.  No questions at this time.  Patient left unit in stable condition with all personal belongings accompanied by staff.  K. Casi Westerfeld, RN------------------------  

## 2013-12-05 LAB — CULTURE, BETA STREP (GROUP B ONLY)

## 2013-12-06 LAB — LUPUS ANTICOAGULANT PANEL
DRVVT: 34.8 secs (ref <42.9)
Lupus Anticoagulant: NOT DETECTED
PTT Lupus Anticoagulant: 34.4 secs (ref 28.0–43.0)

## 2013-12-06 LAB — ANA: Anti Nuclear Antibody(ANA): NEGATIVE

## 2013-12-06 LAB — CARDIOLIPIN ANTIBODIES, IGG, IGM, IGA
Anticardiolipin IgA: 5 APL U/mL — ABNORMAL LOW (ref <22)
Anticardiolipin IgG: 2 GPL U/mL — ABNORMAL LOW (ref <23)
Anticardiolipin IgM: 0 MPL U/mL — ABNORMAL LOW (ref <11)

## 2013-12-06 NOTE — Progress Notes (Signed)
Post discharge ur review completed. 

## 2013-12-09 LAB — TORCH-IGM(TOXO/ RUB/ CMV/ HSV) W TITER
CMV IGM: 0.8
HSV 1 IgM Abs: NEGATIVE
HSV 2 IgM Abs: NEGATIVE
RPR SCREEN: NONREACTIVE
Rubella IgM Index: <0.9 (ref <0.90)
TOXOPLASMA IGM: NEGATIVE

## 2013-12-10 DEATH — deceased

## 2014-06-13 ENCOUNTER — Encounter (HOSPITAL_COMMUNITY): Payer: Self-pay | Admitting: *Deleted

## 2014-07-05 ENCOUNTER — Encounter (HOSPITAL_BASED_OUTPATIENT_CLINIC_OR_DEPARTMENT_OTHER): Payer: Self-pay | Admitting: *Deleted

## 2014-07-11 ENCOUNTER — Encounter (HOSPITAL_BASED_OUTPATIENT_CLINIC_OR_DEPARTMENT_OTHER): Payer: Self-pay | Admitting: *Deleted

## 2014-07-11 NOTE — Progress Notes (Signed)
Pt instructed npo p mn 12/2 x welbutrin, prozac, adderal w sip of water.  [facility]  Needs hgb on arrival.  [facility]. Informed that all pts are weighed on arrival and if wt is > 350lbs, case may be cancelled.

## 2014-07-14 ENCOUNTER — Ambulatory Visit (HOSPITAL_BASED_OUTPATIENT_CLINIC_OR_DEPARTMENT_OTHER): Payer: BC Managed Care – PPO | Admitting: Anesthesiology

## 2014-07-14 ENCOUNTER — Ambulatory Visit (HOSPITAL_BASED_OUTPATIENT_CLINIC_OR_DEPARTMENT_OTHER)
Admission: RE | Admit: 2014-07-14 | Discharge: 2014-07-14 | Disposition: A | Discharge status: Home / Self Care | Payer: BC Managed Care – PPO | Source: Ambulatory Visit | Attending: Orthopedic Surgery | Admitting: Orthopedic Surgery

## 2014-07-14 ENCOUNTER — Encounter (HOSPITAL_BASED_OUTPATIENT_CLINIC_OR_DEPARTMENT_OTHER): Payer: Self-pay

## 2014-07-14 ENCOUNTER — Encounter (HOSPITAL_BASED_OUTPATIENT_CLINIC_OR_DEPARTMENT_OTHER)
Admission: RE | Disposition: A | Discharge status: Home / Self Care | Payer: Self-pay | Source: Ambulatory Visit | Attending: Orthopedic Surgery

## 2014-07-14 DIAGNOSIS — Z6841 Body Mass Index (BMI) 40.0 and over, adult: Secondary | ICD-10-CM | POA: Diagnosis not present

## 2014-07-14 DIAGNOSIS — F419 Anxiety disorder, unspecified: Secondary | ICD-10-CM | POA: Insufficient documentation

## 2014-07-14 DIAGNOSIS — G4730 Sleep apnea, unspecified: Secondary | ICD-10-CM | POA: Insufficient documentation

## 2014-07-14 DIAGNOSIS — G5602 Carpal tunnel syndrome, left upper limb: Secondary | ICD-10-CM | POA: Diagnosis not present

## 2014-07-14 HISTORY — DX: Carpal tunnel syndrome, left upper limb: G56.02

## 2014-07-14 HISTORY — DX: Sleep apnea, unspecified: G47.30

## 2014-07-14 HISTORY — DX: Anxiety disorder, unspecified: F41.9

## 2014-07-14 HISTORY — PX: CARPAL TUNNEL RELEASE: SHX101

## 2014-07-14 LAB — POCT HEMOGLOBIN-HEMACUE: Hemoglobin: 12.4 g/dL (ref 12.0–15.0)

## 2014-07-14 SURGERY — CARPAL TUNNEL RELEASE
Anesthesia: Monitor Anesthesia Care | Site: Hand | Laterality: Left

## 2014-07-14 MED ORDER — CEFAZOLIN SODIUM 1-5 GM-% IV SOLN
INTRAVENOUS | Status: AC
  Filled 2014-07-14: qty 50

## 2014-07-14 MED ORDER — KETOROLAC TROMETHAMINE 30 MG/ML IJ SOLN
15.0000 mg | Freq: Once | INTRAMUSCULAR | Status: DC | PRN
  Filled 2014-07-14: qty 1

## 2014-07-14 MED ORDER — HYDROCODONE-ACETAMINOPHEN 10-300 MG PO TABS: 1.0000 | ORAL_TABLET | Freq: Four times a day (QID) | ORAL | Status: DC | PRN

## 2014-07-14 MED ORDER — CHLORHEXIDINE GLUCONATE 4 % EX LIQD
60.0000 mL | Freq: Once | CUTANEOUS | Status: DC
  Filled 2014-07-14: qty 60

## 2014-07-14 MED ORDER — FENTANYL CITRATE 0.05 MG/ML IJ SOLN
25.0000 ug | INTRAMUSCULAR | Status: DC | PRN
  Filled 2014-07-14: qty 1

## 2014-07-14 MED ORDER — BUPIVACAINE HCL (PF) 0.25 % IJ SOLN
INTRAMUSCULAR | Status: DC | PRN
  Administered 2014-07-14: 9 mL

## 2014-07-14 MED ORDER — LACTATED RINGERS IV SOLN
INTRAVENOUS | Status: DC
  Administered 2014-07-14 (×2): via INTRAVENOUS
  Filled 2014-07-14: qty 1000

## 2014-07-14 MED ORDER — FENTANYL CITRATE 0.05 MG/ML IJ SOLN
INTRAMUSCULAR | Status: AC
  Filled 2014-07-14: qty 6

## 2014-07-14 MED ORDER — PROMETHAZINE HCL 25 MG/ML IJ SOLN
6.2500 mg | INTRAMUSCULAR | Status: DC | PRN
  Filled 2014-07-14: qty 1

## 2014-07-14 MED ORDER — CEFAZOLIN SODIUM 10 G IJ SOLR
3.0000 g | INTRAMUSCULAR | Status: AC
  Administered 2014-07-14: 3 g via INTRAVENOUS
  Filled 2014-07-14: qty 3000

## 2014-07-14 MED ORDER — PROPOFOL 10 MG/ML IV EMUL
INTRAVENOUS | Status: DC | PRN
  Administered 2014-07-14: 140 ug/kg/min via INTRAVENOUS

## 2014-07-14 MED ORDER — FENTANYL CITRATE 0.05 MG/ML IJ SOLN
INTRAMUSCULAR | Status: DC | PRN
  Administered 2014-07-14 (×2): 25 ug via INTRAVENOUS
  Administered 2014-07-14: 50 ug via INTRAVENOUS

## 2014-07-14 MED ORDER — MIDAZOLAM HCL 2 MG/2ML IJ SOLN
INTRAMUSCULAR | Status: AC
  Filled 2014-07-14: qty 2

## 2014-07-14 MED ORDER — CEFAZOLIN SODIUM-DEXTROSE 2-3 GM-% IV SOLR
INTRAVENOUS | Status: AC
  Filled 2014-07-14: qty 50

## 2014-07-14 MED ORDER — MIDAZOLAM HCL 5 MG/5ML IJ SOLN
INTRAMUSCULAR | Status: DC | PRN
  Administered 2014-07-14: 2 mg via INTRAVENOUS

## 2014-07-14 MED ORDER — LIDOCAINE HCL (PF) 1 % IJ SOLN
INTRAMUSCULAR | Status: DC | PRN
  Administered 2014-07-14: 9 mL

## 2014-07-14 SURGICAL SUPPLY — 43 items
BANDAGE ELASTIC 3 VELCRO ST LF (GAUZE/BANDAGES/DRESSINGS) ×2 IMPLANT
BLADE SURG 15 STRL LF DISP TIS (BLADE) ×1 IMPLANT
BLADE SURG 15 STRL SS (BLADE) ×2
BNDG CMPR 9X4 STRL LF SNTH (GAUZE/BANDAGES/DRESSINGS) ×1
BNDG CONFORM 3 STRL LF (GAUZE/BANDAGES/DRESSINGS) ×2 IMPLANT
BNDG ESMARK 4X9 LF (GAUZE/BANDAGES/DRESSINGS) ×2 IMPLANT
CORDS BIPOLAR (ELECTRODE) ×2 IMPLANT
COVER TABLE BACK 60X90 (DRAPES) ×2 IMPLANT
CUFF TOURN SGL QUICK 18 (TOURNIQUET CUFF) ×1 IMPLANT
CUFF TOURNIQUET SINGLE 18IN (TOURNIQUET CUFF) IMPLANT
DRAPE EXTREMITY T 121X128X90 (DRAPE) ×2 IMPLANT
DRAPE LG THREE QUARTER DISP (DRAPES) ×2 IMPLANT
DRAPE SURG 17X23 STRL (DRAPES) ×2 IMPLANT
DRSG EMULSION OIL 3X3 NADH (GAUZE/BANDAGES/DRESSINGS) IMPLANT
GAUZE XEROFORM 1X8 LF (GAUZE/BANDAGES/DRESSINGS) ×2 IMPLANT
GLOVE BIO SURGEON STRL SZ8 (GLOVE) ×2 IMPLANT
GLOVE BIOGEL PI IND STRL 7.5 (GLOVE) IMPLANT
GLOVE BIOGEL PI IND STRL 8.5 (GLOVE) ×1 IMPLANT
GLOVE BIOGEL PI INDICATOR 7.5 (GLOVE) ×2
GLOVE BIOGEL PI INDICATOR 8.5 (GLOVE) ×1
GLOVE SURG SS PI 7.5 STRL IVOR (GLOVE) ×1 IMPLANT
GOWN STRL REUS W/ TWL LRG LVL3 (GOWN DISPOSABLE) ×1 IMPLANT
GOWN STRL REUS W/ TWL XL LVL3 (GOWN DISPOSABLE) ×1 IMPLANT
GOWN STRL REUS W/TWL LRG LVL3 (GOWN DISPOSABLE) ×2
GOWN STRL REUS W/TWL XL LVL3 (GOWN DISPOSABLE) ×4 IMPLANT
KNIFE CARPAL TUNNEL (BLADE) IMPLANT
NDL HYPO 25X1 1.5 SAFETY (NEEDLE) ×2 IMPLANT
NDL SAFETY ECLIPSE 18X1.5 (NEEDLE) IMPLANT
NEEDLE HYPO 18GX1.5 SHARP (NEEDLE)
NEEDLE HYPO 25X1 1.5 SAFETY (NEEDLE) ×4 IMPLANT
NS IRRIG 500ML POUR BTL (IV SOLUTION) ×2 IMPLANT
PACK BASIN DAY SURGERY FS (CUSTOM PROCEDURE TRAY) ×2 IMPLANT
PAD ALCOHOL SWAB (MISCELLANEOUS) ×8 IMPLANT
PAD CAST 3X4 CTTN HI CHSV (CAST SUPPLIES) IMPLANT
PADDING CAST COTTON 3X4 STRL (CAST SUPPLIES)
SPONGE GAUZE 4X4 12PLY STER LF (GAUZE/BANDAGES/DRESSINGS) ×2 IMPLANT
STOCKINETTE 4X48 STRL (DRAPES) ×2 IMPLANT
SUT PROLENE 4 0 PS 2 18 (SUTURE) ×2 IMPLANT
SYR BULB 3OZ (MISCELLANEOUS) ×2 IMPLANT
SYR CONTROL 10ML LL (SYRINGE) ×4 IMPLANT
TOWEL OR 17X24 6PK STRL BLUE (TOWEL DISPOSABLE) ×2 IMPLANT
TRAY DSU PREP LF (CUSTOM PROCEDURE TRAY) ×2 IMPLANT
UNDERPAD 30X30 INCONTINENT (UNDERPADS AND DIAPERS) ×2 IMPLANT

## 2014-07-14 NOTE — Discharge Instructions (Signed)
KEEP BANDAGE CLEAN AND DRY CALL OFFICE FOR F/U APPT [phone] IN 2 WEEKS KEEP HAND ELEVATED ABOVE HEART OK TO APPLY ICE TO OPERATIVE AREA CONTACT OFFICE IF ANY WORSENING PAIN OR CONCERNS.   Carpal Tunnel Release (Repair), Care After Refer to this sheet in the next few weeks. These discharge instructions provide you with general information on caring for yourself after you leave the hospital. Your caregiver may also give you specific instructions. Your treatment has been planned according to the most current medical practices available, but unavoidable complications sometimes occur. If you have any problems or questions after discharge, please call your caregiver. HOME CARE INSTRUCTIONS   Have a responsible person with you for 24 hours.  Do not drive a car or take public transportation for 24 hours.  Only take over-the-counter or prescription medicines for pain, discomfort, or fever as directed by your caregiver. Take them as directed.  You may put ice on the palm side of the affected wrist.  Put ice in a plastic bag.  Place a towel between your skin and the bag.  Leave the ice on for 15-20 minutes, 03-04 times per day.  If you were given a splint to keep your wrist from bending, use it as directed. It is important to wear the splint at night or as directed. Use the splint for as long as you have pain or numbness in your hand, arm, or wrist. This may take 1 to 2 months.  Keep your hand raised (elevated) above the level of your heart as much as possible. This keeps swelling down and helps with discomfort.  Change bandages (dressings) as directed.  Keep the wound clean and dry. SEEK MEDICAL CARE IF:   You develop pain not relieved with medicines.  You develop numbness of your hand.  You develop bleeding from your surgical site.  You have an oral temperature above 102 F (38.9 C).  You develop redness or swelling of the surgical site.  You develop new, unexplained  problems. SEEK IMMEDIATE MEDICAL CARE IF:   You develop a rash.  You have difficulty breathing.  You develop any reaction or side effects to medicines given. MAKE SURE YOU:   Understand these instructions.  Will watch your condition.  Will get help right away if you are not doing well or get worse. Document Released: 02/15/2005 Document Revised: 05/19/2013 Document Reviewed: 06/04/2007 [facility] Patient Information 2015 [place], [place]. This information is not intended to replace advice given to you by your health care provider. Make sure you discuss any questions you have with your health care provider.  Post Anesthesia Home Care Instructions  Activity: Get plenty of rest for the remainder of the day. A responsible adult should stay with you for 24 hours following the procedure.  For the next 24 hours, DO NOT: -Drive a car -Advertising copywriterperate machinery -Drink alcoholic beverages -Take any medication unless instructed by your physician -Make any legal decisions or sign important papers.  Meals: Start with liquid foods such as gelatin or soup. Progress to regular foods as tolerated. Avoid greasy, spicy, heavy foods. If nausea and/or vomiting occur, drink only clear liquids until the nausea and/or vomiting subsides. Call your physician if vomiting continues.  Special Instructions/Symptoms: Your throat may feel dry or sore from the anesthesia or the breathing tube placed in your throat during surgery. If this causes discomfort, gargle with warm salt water. The discomfort should disappear within 24 hours.

## 2014-07-14 NOTE — H&P (Signed)
[name] is an 33 y.o. female.   Chief Complaint: LEFT HAND NUMBNESS AND TINGLING HPI: PT FOLLOWED IN OFFICE PT WITH S/S C/[name] LEFT HAND CARPAL TUNNEL SYNDROME PT HERE FOR SURGERY NO PRIOR SURGERY TO LEFT HAND   Past Medical History  Diagnosis Date  . Carpal tunnel syndrome on left   . Sleep apnea     uses CPAP occasionally  . Anxiety     Past Surgical History  Procedure Laterality Date  . Laparoscopic gastric banding  2011  . Wisdom tooth extraction      History reviewed. No pertinent family history. Social History:  reports that she has never smoked. She does not have any smokeless tobacco history on file. She reports that she drinks about 0.6 oz of alcohol per week. She reports that she does not use illicit drugs.  Allergies: No Known Allergies  No prescriptions prior to admission    No results found for this or any previous visit (from the past 48 hour(s)). No results found.  ROS NO RECENT ILLNESSES OR HOSPITALIZATIONS  Height 5\' 9"  (1.753 m), weight 157.852 kg (348 lb), last menstrual period 07/09/2014. Physical Exam  General Appearance:  Alert, cooperative, no distress, appears stated age  Head:  Normocephalic, without obvious abnormality, atraumatic  Eyes:  Pupils equal, conjunctiva/corneas clear,         Throat: Lips, mucosa, and tongue normal; teeth and gums normal  Neck: No visible masses     Lungs:   respirations unlabored  Chest Wall:  No tenderness or deformity  Heart:  Regular rate and rhythm,  Abdomen:   Soft, non-tender,         Extremities: LEFT HAND: SKIN INTACT FINGERS WARM WELL PERFUSED ABLE TO PALMAR ABDUCT THUMB AND FLEX ALL FINGERS NVI LEFT HAND  Pulses: 2+ and symmetric  Skin: Skin color, texture, turgor normal, no rashes or lesions     Neurologic: Normal    Assessment/Plan LEFT HAND CARPAL TUNNEL SYNDROME  LEFT HAND CARPAL TUNNEL RELEASE  R/B/A DISCUSSED WITH PT IN OFFICE.  PT VOICED UNDERSTANDING OF PLAN CONSENT SIGNED  DAY OF SURGERY PT SEEN AND EXAMINED PRIOR TO OPERATIVE PROCEDURE/DAY OF SURGERY SITE MARKED. QUESTIONS ANSWERED WILL GO HOME FOLLOWING SURGERY  WE ARE PLANNING SURGERY FOR YOUR UPPER EXTREMITY. THE RISKS AND BENEFITS OF SURGERY INCLUDE BUT NOT LIMITED TO BLEEDING INFECTION, DAMAGE TO NEARBY NERVES ARTERIES TENDONS, FAILURE OF SURGERY TO ACCOMPLISH ITS INTENDED GOALS, PERSISTENT SYMPTOMS AND NEED FOR FURTHER SURGICAL INTERVENTION. WITH THIS IN MIND WE WILL PROCEED. I HAVE DISCUSSED WITH THE PATIENT THE PRE AND POSTOPERATIVE REGIMEN AND THE DOS AND DON'TS. PT VOICED UNDERSTANDING AND INFORMED CONSENT SIGNED.  Sharma CovertORTMANN,[name] 07/14/2014, 8:01 AM

## 2014-07-14 NOTE — Brief Op Note (Signed)
07/14/2014  8:04 AM  PATIENT:  [name]  33 y.o. female  PRE-OPERATIVE DIAGNOSIS:  left carpal tunnel syndrome  POST-OPERATIVE DIAGNOSIS:  SAME  PROCEDURE:  Procedure(s): LEFT CARPAL TUNNEL RELEASE (Left)  SURGEON:  Surgeon(s) and Role:    * Sharma CovertFred W Makynna Manocchio, MD - Primary  PHYSICIAN ASSISTANT:   ASSISTANTS: none   ANESTHESIA:   MAC  EBL:     BLOOD ADMINISTERED:none  DRAINS: none   LOCAL MEDICATIONS USED:  MARCAINE     SPECIMEN:  No Specimen  DISPOSITION OF SPECIMEN:  N/A  COUNTS:  YES  TOURNIQUET:  * No tourniquets in log *  DICTATION: .[id number]  PLAN OF CARE: Discharge to home after PACU  PATIENT DISPOSITION:  PACU - hemodynamically stable.   Delay start of Pharmacological VTE agent (>24hrs) due to surgical blood loss or risk of bleeding: not applicable

## 2014-07-14 NOTE — Anesthesia Postprocedure Evaluation (Signed)
  Anesthesia Post-op Note  Patient: [name]  Procedure(s) Performed: Procedure(s) (LRB): LEFT CARPAL TUNNEL RELEASE (Left)  Patient Location: PACU  Anesthesia Type: MAC  Level of Consciousness: awake and alert   Airway and Oxygen Therapy: Patient Spontanous Breathing  Post-op Pain: mild  Post-op Assessment: Post-op Vital signs reviewed, Patient's Cardiovascular Status Stable, Respiratory Function Stable, Patent Airway and No signs of Nausea or vomiting  Last Vitals:  Filed Vitals:   07/14/14 1400  BP: 115/62  Pulse: 84  Temp:   Resp: 16    Post-op Vital Signs: stable   Complications: No apparent anesthesia complications

## 2014-07-14 NOTE — Transfer of Care (Signed)
Immediate Anesthesia Transfer of Care Note  Patient: [name]  Procedure(s) Performed: Procedure(s) (LRB): LEFT CARPAL TUNNEL RELEASE (Left)  Patient Location: PACU  Anesthesia Type: MAC  Level of Consciousness: awake, alert , oriented and patient cooperative  Airway & Oxygen Therapy: Patient Spontanous Breathing and Patient connected to face mask oxygen  Post-op Assessment: Report given to PACU RN and Post -op Vital signs reviewed and stable  Post vital signs: Reviewed and stable  Complications: No apparent anesthesia complications

## 2014-07-14 NOTE — Anesthesia Preprocedure Evaluation (Signed)
Anesthesia Evaluation  Patient identified by MRN, date of birth, ID band Patient awake    Reviewed: Allergy & Precautions, H&P , NPO status , Patient's Chart, lab work & pertinent test results  Airway Mallampati: III  TM Distance: <3 FB Neck ROM: Full    Dental no notable dental hx.    Pulmonary sleep apnea ,  breath sounds clear to auscultation  + decreased breath sounds      Cardiovascular negative cardio ROS  Rhythm:Regular Rate:Normal     Neuro/Psych Anxiety negative neurological ROS     GI/Hepatic negative GI ROS, Neg liver ROS,   Endo/Other  Morbid obesity  Renal/GU negative Renal ROS  negative genitourinary   Musculoskeletal negative musculoskeletal ROS (+)   Abdominal (+) + obese,   Peds negative pediatric ROS (+)  Hematology negative hematology ROS (+)   Anesthesia Other Findings   Reproductive/Obstetrics negative OB ROS                             Anesthesia Physical Anesthesia Plan  ASA: III  Anesthesia Plan: MAC   Post-op Pain Management:    Induction: Intravenous  Airway Management Planned: Simple Face Mask  Additional Equipment:   Intra-op Plan:   Post-operative Plan:   Informed Consent: I have reviewed the patients History and Physical, chart, labs and discussed the procedure including the risks, benefits and alternatives for the proposed anesthesia with the patient or authorized representative who has indicated his/her understanding and acceptance.   Dental advisory given  Plan Discussed with: CRNA and Surgeon  Anesthesia Plan Comments:         Anesthesia Quick Evaluation

## 2014-07-15 ENCOUNTER — Encounter (HOSPITAL_BASED_OUTPATIENT_CLINIC_OR_DEPARTMENT_OTHER): Payer: Self-pay | Admitting: Orthopedic Surgery

## 2014-07-17 NOTE — Op Note (Signed)
NAMStephanie [name]:  [name], Emily                 ACCOUNT NO.:  [id number]  MEDICAL RECORD NO.:  [id number]  LOCATION:                                 FACILITY:  PHYSICIAN:  Madelynn DoneFred W Deondra Wigger IV, MD  DATE OF BIRTH:  [birth year]  DATE OF PROCEDURE:  07/14/2014 DATE OF DISCHARGE:  07/14/2014                              OPERATIVE REPORT   PREOPERATIVE DIAGNOSIS:  Left hand carpal tunnel syndrome.  POSTOPERATIVE DIAGNOSIS:  Left hand carpal tunnel syndrome.  ATTENDING PHYSICIAN:  Sharma CovertFred W. Makeba Delcastillo IV, MD, who scrubbed and present for the entire procedure.  ASSISTANT SURGEON:  None.  ANESTHESIA:  Xylocaine 1% and 0.25% Marcaine local block with IV sedation.  SURGICAL PROCEDURE:  Left hand carpal tunnel release.  SURGICAL INDICATION:  [name] is a right-hand-dominant female with signs and symptoms consistent with left hand carpal tunnel syndrome. This has been refractory to conservative treatment.  Risks, benefits, and alternatives were discussed in detail with the patient, and signed informed consent was obtained.  Risks include but not limited to bleeding; infection; damage to nearby nerves, arteries, or tendons; loss of motion of wrist and digits, incomplete relief of symptoms, and need for further surgical intervention.  DESCRIPTION OF PROCEDURE:  The patient was properly identified in the preoperative holding area and marked with a permanent marker made on the left hand to indicate the correct operative site.  The patient was brought back to the operating room, placed supine on anesthesia room table where the IV sedation was administered.  The patient tolerated this well.  A well-padded tourniquet was placed on the left forearm and sealed with 1000 drape.  The left upper extremity was then prepped and draped in normal sterile fashion.  Time-out was called.  Correct site was identified, and procedure then began.  Attention was then turned to the left hand where several centimeters incision  made directly in the mid palm.  The limb was then elevated and tourniquet insufflated. Dissection was carried down through the skin and subcutaneous tissues. The local anesthetic had been administered.  The palmar fascia was then incised longitudinally sharply.  Following this, deep dissection was carried down in the distal one-half transverse carpal ligament, which was carefully identified and released under direct visualization with a 15 blade.  Further exposure was carried out proximally, the remaining portion of the transverse carpal ligament as well as portion of the antebrachial fascia were then released.  No other abnormalities were noted within the contents of the carpal canal.  The wound was then thoroughly irrigated.  Tourniquet was deflated.  Hemostasis was obtained.  Skin was closed using horizontal mattress Prolene sutures. Xeroform dressing, sterile compressive bandage applied.  The patient tolerated the procedure well, returned to the recovery room in good condition.  POSTPROCEDURE PLAN:  The patient discharged to home, to be seen back in the office in approximately 2 weeks for wound check, suture removal, and begin postoperative carpal tunnel eval and treat.     Madelynn DoneFred W Kenslee Achorn IV, MD     FWO/MEDQ  D:  07/14/2014  T:  07/15/2014  Job:  [id number]

## 2016-04-11 DIAGNOSIS — J019 Acute sinusitis, unspecified: Secondary | ICD-10-CM | POA: Diagnosis not present

## 2016-04-22 DIAGNOSIS — F410 Panic disorder [episodic paroxysmal anxiety] without agoraphobia: Secondary | ICD-10-CM | POA: Diagnosis not present

## 2016-04-22 DIAGNOSIS — F3342 Major depressive disorder, recurrent, in full remission: Secondary | ICD-10-CM | POA: Diagnosis not present

## 2016-04-22 DIAGNOSIS — F900 Attention-deficit hyperactivity disorder, predominantly inattentive type: Secondary | ICD-10-CM | POA: Diagnosis not present

## 2016-05-02 DIAGNOSIS — Z01419 Encounter for gynecological examination (general) (routine) without abnormal findings: Secondary | ICD-10-CM | POA: Diagnosis not present

## 2016-05-02 DIAGNOSIS — Z113 Encounter for screening for infections with a predominantly sexual mode of transmission: Secondary | ICD-10-CM | POA: Diagnosis not present

## 2016-05-13 DIAGNOSIS — J301 Allergic rhinitis due to pollen: Secondary | ICD-10-CM | POA: Diagnosis not present

## 2016-05-13 DIAGNOSIS — J3089 Other allergic rhinitis: Secondary | ICD-10-CM | POA: Diagnosis not present

## 2016-05-13 DIAGNOSIS — J069 Acute upper respiratory infection, unspecified: Secondary | ICD-10-CM | POA: Diagnosis not present

## 2016-06-11 DIAGNOSIS — Z23 Encounter for immunization: Secondary | ICD-10-CM | POA: Diagnosis not present

## 2016-06-11 DIAGNOSIS — Z0000 Encounter for general adult medical examination without abnormal findings: Secondary | ICD-10-CM | POA: Diagnosis not present

## 2016-06-13 DIAGNOSIS — Z6841 Body Mass Index (BMI) 40.0 and over, adult: Secondary | ICD-10-CM | POA: Diagnosis not present

## 2016-06-13 DIAGNOSIS — Z9884 Bariatric surgery status: Secondary | ICD-10-CM | POA: Diagnosis not present

## 2016-06-24 DIAGNOSIS — Z4789 Encounter for other orthopedic aftercare: Secondary | ICD-10-CM | POA: Diagnosis not present

## 2016-06-24 DIAGNOSIS — G5602 Carpal tunnel syndrome, left upper limb: Secondary | ICD-10-CM | POA: Diagnosis not present

## 2016-06-24 DIAGNOSIS — M67432 Ganglion, left wrist: Secondary | ICD-10-CM | POA: Diagnosis not present

## 2016-08-04 DIAGNOSIS — H1089 Other conjunctivitis: Secondary | ICD-10-CM | POA: Diagnosis not present

## 2016-08-09 DIAGNOSIS — F4323 Adjustment disorder with mixed anxiety and depressed mood: Secondary | ICD-10-CM | POA: Diagnosis not present

## 2016-08-12 HISTORY — PX: LAPAROSCOPIC GASTRIC SLEEVE RESECTION: SHX5895

## 2016-09-05 DIAGNOSIS — K219 Gastro-esophageal reflux disease without esophagitis: Secondary | ICD-10-CM | POA: Diagnosis not present

## 2016-09-05 DIAGNOSIS — Z4651 Encounter for fitting and adjustment of gastric lap band: Secondary | ICD-10-CM | POA: Diagnosis not present

## 2016-09-05 DIAGNOSIS — Z9884 Bariatric surgery status: Secondary | ICD-10-CM | POA: Diagnosis not present

## 2016-09-12 DIAGNOSIS — J069 Acute upper respiratory infection, unspecified: Secondary | ICD-10-CM | POA: Diagnosis not present

## 2016-09-18 DIAGNOSIS — H93293 Other abnormal auditory perceptions, bilateral: Secondary | ICD-10-CM | POA: Diagnosis not present

## 2016-09-18 DIAGNOSIS — H9209 Otalgia, unspecified ear: Secondary | ICD-10-CM | POA: Diagnosis not present

## 2016-09-24 ENCOUNTER — Institutional Professional Consult (permissible substitution): Payer: BLUE CROSS/BLUE SHIELD | Admitting: Neurology

## 2016-09-26 DIAGNOSIS — Z09 Encounter for follow-up examination after completed treatment for conditions other than malignant neoplasm: Secondary | ICD-10-CM | POA: Diagnosis not present

## 2016-09-26 DIAGNOSIS — Z713 Dietary counseling and surveillance: Secondary | ICD-10-CM | POA: Diagnosis not present

## 2016-10-07 DIAGNOSIS — J301 Allergic rhinitis due to pollen: Secondary | ICD-10-CM | POA: Diagnosis not present

## 2016-10-07 DIAGNOSIS — J019 Acute sinusitis, unspecified: Secondary | ICD-10-CM | POA: Diagnosis not present

## 2016-10-07 DIAGNOSIS — R05 Cough: Secondary | ICD-10-CM | POA: Diagnosis not present

## 2016-10-07 DIAGNOSIS — J3089 Other allergic rhinitis: Secondary | ICD-10-CM | POA: Diagnosis not present

## 2016-10-10 DIAGNOSIS — R635 Abnormal weight gain: Secondary | ICD-10-CM | POA: Diagnosis not present

## 2016-10-10 DIAGNOSIS — Z6841 Body Mass Index (BMI) 40.0 and over, adult: Secondary | ICD-10-CM | POA: Diagnosis not present

## 2016-10-10 DIAGNOSIS — F329 Major depressive disorder, single episode, unspecified: Secondary | ICD-10-CM | POA: Diagnosis not present

## 2016-10-14 DIAGNOSIS — F900 Attention-deficit hyperactivity disorder, predominantly inattentive type: Secondary | ICD-10-CM | POA: Diagnosis not present

## 2016-10-14 DIAGNOSIS — F3342 Major depressive disorder, recurrent, in full remission: Secondary | ICD-10-CM | POA: Diagnosis not present

## 2016-10-17 DIAGNOSIS — R635 Abnormal weight gain: Secondary | ICD-10-CM | POA: Diagnosis not present

## 2016-10-30 DIAGNOSIS — K224 Dyskinesia of esophagus: Secondary | ICD-10-CM | POA: Diagnosis not present

## 2016-10-30 DIAGNOSIS — K219 Gastro-esophageal reflux disease without esophagitis: Secondary | ICD-10-CM | POA: Diagnosis not present

## 2016-10-30 DIAGNOSIS — Z9884 Bariatric surgery status: Secondary | ICD-10-CM | POA: Diagnosis not present

## 2016-11-11 DIAGNOSIS — J3089 Other allergic rhinitis: Secondary | ICD-10-CM | POA: Diagnosis not present

## 2016-11-11 DIAGNOSIS — J301 Allergic rhinitis due to pollen: Secondary | ICD-10-CM | POA: Diagnosis not present

## 2016-11-11 DIAGNOSIS — L299 Pruritus, unspecified: Secondary | ICD-10-CM | POA: Diagnosis not present

## 2016-11-21 DIAGNOSIS — F509 Eating disorder, unspecified: Secondary | ICD-10-CM | POA: Diagnosis not present

## 2016-11-21 DIAGNOSIS — E669 Obesity, unspecified: Secondary | ICD-10-CM | POA: Diagnosis not present

## 2016-11-21 DIAGNOSIS — R7303 Prediabetes: Secondary | ICD-10-CM | POA: Diagnosis not present

## 2016-11-21 DIAGNOSIS — R635 Abnormal weight gain: Secondary | ICD-10-CM | POA: Diagnosis not present

## 2016-12-11 DIAGNOSIS — M5416 Radiculopathy, lumbar region: Secondary | ICD-10-CM | POA: Diagnosis not present

## 2016-12-12 DIAGNOSIS — R635 Abnormal weight gain: Secondary | ICD-10-CM | POA: Diagnosis not present

## 2016-12-12 DIAGNOSIS — R7309 Other abnormal glucose: Secondary | ICD-10-CM | POA: Diagnosis not present

## 2016-12-12 DIAGNOSIS — Z6841 Body Mass Index (BMI) 40.0 and over, adult: Secondary | ICD-10-CM | POA: Diagnosis not present

## 2016-12-31 DIAGNOSIS — R7303 Prediabetes: Secondary | ICD-10-CM | POA: Diagnosis not present

## 2016-12-31 DIAGNOSIS — Z6841 Body Mass Index (BMI) 40.0 and over, adult: Secondary | ICD-10-CM | POA: Diagnosis not present

## 2016-12-31 DIAGNOSIS — E669 Obesity, unspecified: Secondary | ICD-10-CM | POA: Diagnosis not present

## 2016-12-31 DIAGNOSIS — Z713 Dietary counseling and surveillance: Secondary | ICD-10-CM | POA: Diagnosis not present

## 2016-12-31 DIAGNOSIS — R635 Abnormal weight gain: Secondary | ICD-10-CM | POA: Diagnosis not present

## 2016-12-31 DIAGNOSIS — R948 Abnormal results of function studies of other organs and systems: Secondary | ICD-10-CM | POA: Diagnosis not present

## 2017-01-09 DIAGNOSIS — M5416 Radiculopathy, lumbar region: Secondary | ICD-10-CM | POA: Diagnosis not present

## 2017-01-14 DIAGNOSIS — M5416 Radiculopathy, lumbar region: Secondary | ICD-10-CM | POA: Diagnosis not present

## 2017-01-20 NOTE — Progress Notes (Signed)
Proep on 6/14.  Need orders in epic

## 2017-01-22 ENCOUNTER — Other Ambulatory Visit (HOSPITAL_COMMUNITY): Payer: Self-pay | Admitting: *Deleted

## 2017-01-22 NOTE — Patient Instructions (Signed)
[name]  01/22/2017   Your procedure is scheduled on: 01-29-17  [facility]  Entrance [place]  elevators to 3rd floor to  Short Stay Center at 845 AM.   Call this number if you have problems the morning of surgery [phone]   Remember: ONLY 1 PERSON MAY GO WITH YOU TO SHORT STAY TO GET  READY MORNING OF YOUR SURGERY.  Do not eat food or drink liquids :After Midnight.     Take these medicines the morning of surgery with A SIP OF WATER: ALPRAZOLAM (XANAX) IF NEEDED, PRISTIQ DO NOT TAKE ANY DIABETIC MEDICATIONS DAY OF YOUR SURGERY                               You may not have any metal on your body including hair pins and              piercings  Do not wear jewelry, make-up, lotions, powders or perfumes, deodorant             Do not wear nail polish.  Do not shave  48 hours prior to surgery.              Men may shave face and neck.   Do not bring valuables to the hospital. [facility] IS NOT             RESPONSIBLE   FOR VALUABLES.  Contacts, dentures or bridgework may not be worn into surgery.  Leave suitcase in the car. After surgery it may be brought to your room.                   Please read over the following fact sheets you were given: _____________________________________________________________________             How to Manage Your Diabetes Before and After Surgery  Why is it important to control my blood sugar before and after surgery? . Improving blood sugar levels before and after surgery helps healing and can limit problems. . A way of improving blood sugar control is eating a healthy diet by: o  Eating less sugar and carbohydrates o  Increasing activity/exercise o  Talking with your doctor about reaching your blood sugar goals . High blood sugars (greater than 180 mg/dL) can raise your risk of infections and slow your recovery, so you will need to focus on controlling your diabetes during the weeks before  surgery. . Make sure that the doctor who takes care of your diabetes knows about your planned surgery including the date and location.  How do I manage my blood sugar before surgery? . Check your blood sugar at least 4 times a day, starting 2 days before surgery, to make sure that the level is not too high or low. o Check your blood sugar the morning of your surgery when you wake up and every 2 hours until you get to the Short Stay unit. . If your blood sugar is less than 70 mg/dL, you will need to treat for low blood sugar: o Do not take insulin. o Treat a low blood sugar (less than 70 mg/dL) with  cup of clear juice (cranberry or apple), 4 glucose tablets, OR glucose gel. o Recheck blood sugar in 15 minutes after treatment (to make sure it is greater than 70 mg/dL).  If your blood sugar is not greater than 70 mg/dL on recheck, call [phone] for further instructions. . Report your blood sugar to the short stay nurse when you get to Short Stay.  . If you are admitted to the hospital after surgery: o Your blood sugar will be checked by the staff and you will probably be given insulin after surgery (instead of oral diabetes medicines) to make sure you have good blood sugar levels. o The goal for blood sugar control after surgery is 80-180 mg/dL.   WHAT DO I DO ABOUT MY DIABETES MEDICATION?   . TAKE YOUR METFORMIN AS USUAL DAY BEFORE SURGERY ON 01-28-17      . THE MORNING OF SURGERY, DO NOT TAKE YOUR METFORMIN     Patient Signature:  Date:   Nurse Signature:  Date:   Reviewed and Endorsed by [facility] Patient Education Committee, August 2015[facility] - Preparing for Surgery Before surgery, you can play an important role.  Because skin is not sterile, your skin needs to be as free of germs as possible.  You can reduce the number of germs on your skin by washing with CHG (chlorahexidine gluconate) soap before surgery.  CHG is an antiseptic cleaner which kills germs and bonds with the  skin to continue killing germs even after washing. Please DO NOT use if you have an allergy to CHG or antibacterial soaps.  If your skin becomes reddened/irritated stop using the CHG and inform your nurse when you arrive at Short Stay. Do not shave (including legs and underarms) for at least 48 hours prior to the first CHG shower.  You may shave your face/neck. Please follow these instructions carefully:  1.  Shower with CHG Soap the night before surgery and the  morning of Surgery.  2.  If you choose to wash your hair, wash your hair first as usual with your  normal  shampoo.  3.  After you shampoo, rinse your hair and body thoroughly to remove the  shampoo.                           4.  Use CHG as you would any other liquid soap.  You can apply chg directly  to the skin and wash                       Gently with a scrungie or clean washcloth.  5.  Apply the CHG Soap to your body ONLY FROM THE NECK DOWN.   Do not use on face/ open                           Wound or open sores. Avoid contact with eyes, ears mouth and genitals (private parts).                       Wash face,  Genitals (private parts) with your normal soap.             6.  Wash thoroughly, paying special attention to the area where your surgery  will be performed.  7.  Thoroughly rinse your body with warm water from the neck down.  8.  DO NOT shower/wash with your normal soap after using and rinsing off  the CHG Soap.                9.  Pat yourself dry  with a clean towel.            10.  Wear clean pajamas.            11.  Place clean sheets on your bed the night of your first shower and do not  sleep with pets. Day of Surgery : Do not apply any lotions/deodorants the morning of surgery.  Please wear clean clothes to the hospital/surgery center.   ________________________________________________________________________   Incentive Spirometer  An incentive spirometer is a tool that can help keep your lungs clear and active.  This tool measures how well you are filling your lungs with each breath. Taking long deep breaths may help reverse or decrease the chance of developing breathing (pulmonary) problems (especially infection) following:  A long period of time when you are unable to move or be active. BEFORE THE PROCEDURE   If the spirometer includes an indicator to show your best effort, your nurse or respiratory therapist will set it to a desired goal.  If possible, sit up straight or lean slightly forward. Try not to slouch.  Hold the incentive spirometer in an upright position. INSTRUCTIONS FOR USE  1. Sit on the edge of your bed if possible, or sit up as far as you can in bed or on a chair. 2. Hold the incentive spirometer in an upright position. 3. Breathe out normally. 4. Place the mouthpiece in your mouth and seal your lips tightly around it. 5. Breathe in slowly and as deeply as possible, raising the piston or the ball toward the top of the column. 6. Hold your breath for 3-5 seconds or for as long as possible. Allow the piston or ball to fall to the bottom of the column. 7. Remove the mouthpiece from your mouth and breathe out normally. 8. Rest for a few seconds and repeat Steps 1 through 7 at least 10 times every 1-2 hours when you are awake. Take your time and take a few normal breaths between deep breaths. 9. The spirometer may include an indicator to show your best effort. Use the indicator as a goal to work toward during each repetition. 10. After each set of 10 deep breaths, practice coughing to be sure your lungs are clear. If you have an incision (the cut made at the time of surgery), support your incision when coughing by placing a pillow or rolled up towels firmly against it. Once you are able to get out of bed, walk around indoors and cough well. You may stop using the incentive spirometer when instructed by your caregiver.  RISKS AND COMPLICATIONS  Take your time so you do not get dizzy or  light-headed.  If you are in pain, you may need to take or ask for pain medication before doing incentive spirometry. It is harder to take a deep breath if you are having pain. AFTER USE  Rest and breathe slowly and easily.  It can be helpful to keep track of a log of your progress. Your caregiver can provide you with a simple table to help with this. If you are using the spirometer at home, follow these instructions: SEEK MEDICAL CARE IF:   You are having difficultly using the spirometer.  You have trouble using the spirometer as often as instructed.  Your pain medication is not giving enough relief while using the spirometer.  You develop fever of 100.5 F (38.1 C) or higher. SEEK IMMEDIATE MEDICAL CARE IF:   You cough up bloody sputum that had  not been present before.  You develop fever of 102 F (38.9 C) or greater.  You develop worsening pain at or near the incision site. MAKE SURE YOU:   Understand these instructions.  Will watch your condition.  Will get help right away if you are not doing well or get worse. Document Released: 12/09/2006 Document Revised: 10/21/2011 Document Reviewed: 02/09/2007 [facility] Patient Information 2014 [place], [place].   ________________________________________________________________________

## 2017-01-23 ENCOUNTER — Ambulatory Visit (HOSPITAL_COMMUNITY)
Admission: RE | Admit: 2017-01-23 | Discharge: 2017-01-23 | Disposition: A | Discharge status: Home / Self Care | Payer: BLUE CROSS/BLUE SHIELD | Source: Ambulatory Visit | Attending: Surgical | Admitting: Surgical

## 2017-01-23 ENCOUNTER — Encounter (HOSPITAL_COMMUNITY)
Admission: RE | Admit: 2017-01-23 | Discharge: 2017-01-23 | Disposition: A | Discharge status: Home / Self Care | Payer: BLUE CROSS/BLUE SHIELD | Source: Ambulatory Visit | Attending: Orthopedic Surgery | Admitting: Orthopedic Surgery

## 2017-01-23 ENCOUNTER — Encounter (HOSPITAL_COMMUNITY): Payer: Self-pay

## 2017-01-23 DIAGNOSIS — Z01810 Encounter for preprocedural cardiovascular examination: Secondary | ICD-10-CM | POA: Insufficient documentation

## 2017-01-23 DIAGNOSIS — F419 Anxiety disorder, unspecified: Secondary | ICD-10-CM | POA: Insufficient documentation

## 2017-01-23 DIAGNOSIS — E119 Type 2 diabetes mellitus without complications: Secondary | ICD-10-CM | POA: Insufficient documentation

## 2017-01-23 DIAGNOSIS — G4730 Sleep apnea, unspecified: Secondary | ICD-10-CM | POA: Diagnosis not present

## 2017-01-23 DIAGNOSIS — M48061 Spinal stenosis, lumbar region without neurogenic claudication: Secondary | ICD-10-CM | POA: Insufficient documentation

## 2017-01-23 DIAGNOSIS — M5126 Other intervertebral disc displacement, lumbar region: Secondary | ICD-10-CM | POA: Diagnosis not present

## 2017-01-23 DIAGNOSIS — Z01818 Encounter for other preprocedural examination: Secondary | ICD-10-CM | POA: Insufficient documentation

## 2017-01-23 DIAGNOSIS — Z01812 Encounter for preprocedural laboratory examination: Secondary | ICD-10-CM | POA: Insufficient documentation

## 2017-01-23 DIAGNOSIS — M545 Low back pain, unspecified: Secondary | ICD-10-CM

## 2017-01-23 DIAGNOSIS — M4807 Spinal stenosis, lumbosacral region: Secondary | ICD-10-CM | POA: Diagnosis not present

## 2017-01-23 DIAGNOSIS — F329 Major depressive disorder, single episode, unspecified: Secondary | ICD-10-CM | POA: Insufficient documentation

## 2017-01-23 HISTORY — DX: Type 2 diabetes mellitus without complications: E11.9

## 2017-01-23 HISTORY — DX: Depression, unspecified: F32.A

## 2017-01-23 HISTORY — DX: Major depressive disorder, single episode, unspecified: F32.9

## 2017-01-23 HISTORY — DX: Spinal stenosis, lumbar region without neurogenic claudication: M48.061

## 2017-01-23 LAB — CBC WITH DIFFERENTIAL/PLATELET
Basophils Absolute: 0 10*3/uL (ref 0.0–0.1)
Basophils Relative: 1 %
Eosinophils Absolute: 0.2 10*3/uL (ref 0.0–0.7)
Eosinophils Relative: 2 %
HCT: 36.4 % (ref 36.0–46.0)
Hemoglobin: 12.1 g/dL (ref 12.0–15.0)
Lymphocytes Relative: 37 %
Lymphs Abs: 2.9 10*3/uL (ref 0.7–4.0)
MCH: 30 pg (ref 26.0–34.0)
MCHC: 33.2 g/dL (ref 30.0–36.0)
MCV: 90.1 fL (ref 78.0–100.0)
Monocytes Absolute: 0.4 10*3/uL (ref 0.1–1.0)
Monocytes Relative: 5 %
Neutro Abs: 4.4 10*3/uL (ref 1.7–7.7)
Neutrophils Relative %: 55 %
Platelets: 322 10*3/uL (ref 150–400)
RBC: 4.04 MIL/uL (ref 3.87–5.11)
RDW: 13 % (ref 11.5–15.5)
WBC: 7.9 10*3/uL (ref 4.0–10.5)

## 2017-01-23 LAB — URINALYSIS, ROUTINE W REFLEX MICROSCOPIC
Bilirubin Urine: NEGATIVE
Glucose, UA: NEGATIVE mg/dL
Hgb urine dipstick: NEGATIVE
Ketones, ur: NEGATIVE mg/dL
Leukocytes, UA: NEGATIVE
Nitrite: NEGATIVE
Protein, ur: NEGATIVE mg/dL
Specific Gravity, Urine: 1.02 (ref 1.005–1.030)
pH: 6 (ref 5.0–8.0)

## 2017-01-23 LAB — GLUCOSE, CAPILLARY: GLUCOSE-CAPILLARY: 110 mg/dL — AB (ref 65–99)

## 2017-01-23 LAB — COMPREHENSIVE METABOLIC PANEL
ALT: 28 U/L (ref 14–54)
AST: 23 U/L (ref 15–41)
Albumin: 4.2 g/dL (ref 3.5–5.0)
Alkaline Phosphatase: 47 U/L (ref 38–126)
Anion gap: 9 (ref 5–15)
BUN: 14 mg/dL (ref 6–20)
CO2: 27 mmol/L (ref 22–32)
Calcium: 9.6 mg/dL (ref 8.9–10.3)
Chloride: 103 mmol/L (ref 101–111)
Creatinine, Ser: 1.05 mg/dL — ABNORMAL HIGH (ref 0.44–1.00)
GFR calc Af Amer: >60 mL/min (ref >60)
GFR calc non Af Amer: >60 mL/min (ref >60)
Glucose, Bld: 112 mg/dL — ABNORMAL HIGH (ref 65–99)
Potassium: 3.8 mmol/L (ref 3.5–5.1)
Sodium: 139 mmol/L (ref 135–145)
Total Bilirubin: 0.5 mg/dL (ref 0.3–1.2)
Total Protein: 7.3 g/dL (ref 6.5–8.1)

## 2017-01-23 LAB — SURGICAL PCR SCREEN
MRSA, PCR: NEGATIVE
Staphylococcus aureus: NEGATIVE

## 2017-01-23 LAB — APTT: aPTT: 30 seconds (ref 24–36)

## 2017-01-23 LAB — HCG, SERUM, QUALITATIVE: Preg, Serum: NEGATIVE

## 2017-01-23 LAB — PROTIME-INR
INR: 0.99
Prothrombin Time: 13.1 seconds (ref 11.4–15.2)

## 2017-01-24 LAB — HEMOGLOBIN A1C
Hgb A1c MFr Bld: 5.5 % (ref 4.8–5.6)
Mean Plasma Glucose: 111 mg/dL

## 2017-01-28 NOTE — H&P (Signed)
[name] is an 36 y.o. female.   Chief Complaint: low back pain HPI: [name] presented with a chief complaint of low back pain and left leg pain. At the time of presentation she said that she has had this for about a week. She said that she was doing some stretching and marching in place with an exercise program and noticed discomfort the next day. She says that she is getting pain in the low back and buttocks. It does radiate down the leg. No groin pain, no pain in the knees, no numbness or tingling, no history of low back or hip problems. She has not seen improvement with rest, anti-inflammatories, ice or heat. No change in bladder or bowel function. MRI showed a large herniated disc a L5-S1 on the left.   Past Medical History:  Diagnosis Date  . Anxiety   . Carpal tunnel syndrome on left   . Depression   . Diabetes mellitus without complication (HCC)    type 2 on metformin since april  . Lumbar spinal stenosis   . Sleep apnea    no cpap used    Past Surgical History:  Procedure Laterality Date  . CARPAL TUNNEL RELEASE Left 07/14/2014   Procedure: LEFT CARPAL TUNNEL RELEASE;  Surgeon: Sharma Covert, MD;  [facility];  Service: Orthopedics;  Laterality: Left;  . LAPAROSCOPIC GASTRIC BANDING  2011  . WISDOM TOOTH EXTRACTION     Social History:  reports that she has never smoked. She has never used smokeless tobacco. She reports that she drinks alcohol. She reports that she does not use drugs.  Allergies: No Known Allergies   Current Outpatient Prescriptions:  .  ALPRAZolam (XANAX) 0.5 MG tablet, Take 0.5 mg by mouth 3 (three) times daily as needed for anxiety. , Disp: , Rfl:  .  desvenlafaxine (PRISTIQ) 50 MG 24 hr tablet, Take 50 mg by mouth daily., Disp: , Rfl:  .  folic acid (FOLVITE) 800 MCG tablet, Take 400 mcg by mouth daily., Disp: , Rfl:  .  ibuprofen (ADVIL,MOTRIN) 800 MG tablet, Take 800 mg by mouth every 8 (eight) hours as needed for mild pain or  moderate pain., Disp: , Rfl:  .  levocetirizine (XYZAL) 5 MG tablet, Take 5 mg by mouth every evening., Disp: , Rfl:  .  lisdexamfetamine (VYVANSE) 70 MG capsule, Take 70 mg by mouth every morning., Disp: , Rfl:  .  metFORMIN (GLUCOPHAGE) 500 MG tablet, Take 1 tablet by mouth 2 (two) times daily with a meal., Disp: , Rfl: 2 .  montelukast (SINGULAIR) 10 MG tablet, Take 10 mg by mouth at bedtime., Disp: , Rfl:  .  Multiple Vitamins-Minerals (MULTIVITAMIN WITH MINERALS) tablet, Take 1 tablet by mouth daily., Disp: , Rfl:  .  traZODone (DESYREL) 50 MG tablet, Take 150 mg by mouth at bedtime., Disp: , Rfl:    Review of Systems  Constitutional: Negative.   HENT: Negative.   Eyes: Negative.   Respiratory: Negative.   Cardiovascular: Negative.   Gastrointestinal: Negative.   Genitourinary: Negative.   Musculoskeletal: Positive for back pain and myalgias. Negative for falls, joint pain and neck pain.  Skin: Negative.   Neurological: Negative.   Endo/Heme/Allergies: Negative.   Psychiatric/Behavioral: Positive for depression. Negative for hallucinations, memory loss, substance abuse and suicidal ideas. The patient is nervous/anxious. The patient does not have insomnia.    Vitals  Weight: 354 lb Height: 69in Body Surface Area: 2.63 m Body Mass Index: 52.28 kg/m  BP: 115/80 (Sitting, Left Arm, Standard) HR:80 bpm  Last menstrual period 01/22/2017. Physical Exam  Constitutional: She is oriented to person, place, and time. She appears well-developed. No distress.  Morbidly obese  HENT:  Head: Normocephalic and atraumatic.  Right Ear: External ear normal.  Left Ear: External ear normal.  Nose: Nose normal.  Mouth/Throat: Oropharynx is clear and moist.  Eyes: Conjunctivae and EOM are normal.  Neck: Normal range of motion. Neck supple.  Cardiovascular: Normal rate, regular rhythm, normal heart sounds and intact distal pulses.   No murmur heard. Respiratory: Effort normal and  breath sounds normal. No respiratory distress. She has no wheezes.  GI: Soft. Bowel sounds are normal. She exhibits no distension. There is no tenderness.  Musculoskeletal:       Right hip: Normal.       Left hip: Normal.       Lumbar back: She exhibits decreased range of motion, pain and spasm. She exhibits no bony tenderness and no deformity.  Neurological: She is alert and oriented to person, place, and time. She has normal strength. No sensory deficit.  Skin: No rash noted. She is not diaphoretic. No erythema.  Psychiatric: She has a normal mood and affect. Her behavior is normal.     Assessment/Plan Lumbar disc herniation L5-S1 left She needs a central decompressive lumbar laminectomy L5-S1. Risks and benefits of the procedure discussed with the patient by Dr. Terrace ArabiaGiofffre. The patient has elected to proceed with the surgery due to failure to improve with conservative treatments.    H&P performed by Dr. Ranee Gosselinonald Gioffre, MD Documented by Dimitri PedAmber Victorhugo Preis, PA-C  Braysen Cloward Leotis ShamesLAUREN, PA-C 01/28/2017, 2:29 PM

## 2017-01-29 ENCOUNTER — Ambulatory Visit (HOSPITAL_COMMUNITY): Payer: BLUE CROSS/BLUE SHIELD | Admitting: Anesthesiology

## 2017-01-29 ENCOUNTER — Encounter (HOSPITAL_COMMUNITY): Payer: Self-pay | Admitting: *Deleted

## 2017-01-29 ENCOUNTER — Observation Stay (HOSPITAL_COMMUNITY)
Admission: RE | Admit: 2017-01-29 | Discharge: 2017-01-30 | Disposition: A | Discharge status: Home / Self Care | Payer: BLUE CROSS/BLUE SHIELD | Source: Ambulatory Visit | Attending: Orthopedic Surgery | Admitting: Orthopedic Surgery

## 2017-01-29 ENCOUNTER — Encounter (HOSPITAL_COMMUNITY)
Admission: RE | Disposition: A | Discharge status: Home / Self Care | Payer: Self-pay | Source: Ambulatory Visit | Attending: Orthopedic Surgery

## 2017-01-29 ENCOUNTER — Ambulatory Visit (HOSPITAL_COMMUNITY): Payer: BLUE CROSS/BLUE SHIELD

## 2017-01-29 DIAGNOSIS — Z6841 Body Mass Index (BMI) 40.0 and over, adult: Secondary | ICD-10-CM | POA: Diagnosis not present

## 2017-01-29 DIAGNOSIS — M5126 Other intervertebral disc displacement, lumbar region: Secondary | ICD-10-CM | POA: Diagnosis not present

## 2017-01-29 DIAGNOSIS — Z7984 Long term (current) use of oral hypoglycemic drugs: Secondary | ICD-10-CM | POA: Insufficient documentation

## 2017-01-29 DIAGNOSIS — E119 Type 2 diabetes mellitus without complications: Secondary | ICD-10-CM | POA: Diagnosis not present

## 2017-01-29 DIAGNOSIS — F419 Anxiety disorder, unspecified: Secondary | ICD-10-CM | POA: Diagnosis not present

## 2017-01-29 DIAGNOSIS — M5127 Other intervertebral disc displacement, lumbosacral region: Secondary | ICD-10-CM | POA: Diagnosis not present

## 2017-01-29 DIAGNOSIS — M48061 Spinal stenosis, lumbar region without neurogenic claudication: Secondary | ICD-10-CM | POA: Insufficient documentation

## 2017-01-29 DIAGNOSIS — F329 Major depressive disorder, single episode, unspecified: Secondary | ICD-10-CM | POA: Insufficient documentation

## 2017-01-29 DIAGNOSIS — M79605 Pain in left leg: Secondary | ICD-10-CM | POA: Insufficient documentation

## 2017-01-29 DIAGNOSIS — M4807 Spinal stenosis, lumbosacral region: Secondary | ICD-10-CM | POA: Diagnosis not present

## 2017-01-29 DIAGNOSIS — G4730 Sleep apnea, unspecified: Secondary | ICD-10-CM | POA: Diagnosis not present

## 2017-01-29 DIAGNOSIS — Z419 Encounter for procedure for purposes other than remedying health state, unspecified: Secondary | ICD-10-CM

## 2017-01-29 HISTORY — PX: LUMBAR LAMINECTOMY/DECOMPRESSION MICRODISCECTOMY: SHX5026

## 2017-01-29 LAB — GLUCOSE, CAPILLARY
GLUCOSE-CAPILLARY: 167 mg/dL — AB (ref 65–99)
Glucose-Capillary: 141 mg/dL — ABNORMAL HIGH (ref 65–99)
Glucose-Capillary: 150 mg/dL — ABNORMAL HIGH (ref 65–99)
Glucose-Capillary: 145 mg/dL — ABNORMAL HIGH (ref 65–99)

## 2017-01-29 SURGERY — LUMBAR LAMINECTOMY/DECOMPRESSION MICRODISCECTOMY
Anesthesia: General

## 2017-01-29 MED ORDER — LIDOCAINE-EPINEPHRINE 1 %-1:100000 IJ SOLN
INTRAMUSCULAR | Status: AC
Start: 2017-01-29 — End: 2017-01-29
  Filled 2017-01-29: qty 1

## 2017-01-29 MED ORDER — FENTANYL CITRATE (PF) 250 MCG/5ML IJ SOLN
INTRAMUSCULAR | Status: AC
  Filled 2017-01-29: qty 5

## 2017-01-29 MED ORDER — INSULIN ASPART 100 UNIT/ML ~~LOC~~ SOLN: 0.0000 [IU] | Freq: Three times a day (TID) | SUBCUTANEOUS | Status: DC

## 2017-01-29 MED ORDER — PROMETHAZINE HCL 25 MG/ML IJ SOLN: 6.2500 mg | INTRAMUSCULAR | Status: DC | PRN

## 2017-01-29 MED ORDER — HYDROMORPHONE HCL 1 MG/ML IJ SOLN
INTRAMUSCULAR | Status: DC | PRN
  Administered 2017-01-29 (×2): 1 mg via INTRAVENOUS

## 2017-01-29 MED ORDER — PROPOFOL 10 MG/ML IV BOLUS
INTRAVENOUS | Status: DC | PRN
  Administered 2017-01-29: 200 mg via INTRAVENOUS

## 2017-01-29 MED ORDER — PROPOFOL 10 MG/ML IV BOLUS
INTRAVENOUS | Status: AC
  Filled 2017-01-29: qty 20

## 2017-01-29 MED ORDER — SUCCINYLCHOLINE CHLORIDE 200 MG/10ML IV SOSY
PREFILLED_SYRINGE | INTRAVENOUS | Status: AC
  Filled 2017-01-29: qty 10

## 2017-01-29 MED ORDER — PHENOL 1.4 % MT LIQD: 1.0000 | OROMUCOSAL | Status: DC | PRN

## 2017-01-29 MED ORDER — FENTANYL CITRATE (PF) 250 MCG/5ML IJ SOLN
INTRAMUSCULAR | Status: DC | PRN
  Administered 2017-01-29: 150 ug via INTRAVENOUS
  Administered 2017-01-29: 100 ug via INTRAVENOUS

## 2017-01-29 MED ORDER — MONTELUKAST SODIUM 10 MG PO TABS
10.0000 mg | ORAL_TABLET | Freq: Every day | ORAL | Status: DC
  Administered 2017-01-29: 10 mg via ORAL
  Filled 2017-01-29: qty 1

## 2017-01-29 MED ORDER — FLEET ENEMA 7-19 GM/118ML RE ENEM: 1.0000 | ENEMA | Freq: Once | RECTAL | Status: DC | PRN

## 2017-01-29 MED ORDER — SUCCINYLCHOLINE CHLORIDE 20 MG/ML IJ SOLN
INTRAMUSCULAR | Status: DC | PRN
  Administered 2017-01-29: 180 mg via INTRAVENOUS

## 2017-01-29 MED ORDER — BACITRACIN-NEOMYCIN-POLYMYXIN 400-5-5000 EX OINT
TOPICAL_OINTMENT | CUTANEOUS | Status: AC
  Filled 2017-01-29: qty 1

## 2017-01-29 MED ORDER — MIDAZOLAM HCL 5 MG/5ML IJ SOLN
INTRAMUSCULAR | Status: DC | PRN
  Administered 2017-01-29: 2 mg via INTRAVENOUS

## 2017-01-29 MED ORDER — DEXAMETHASONE SODIUM PHOSPHATE 10 MG/ML IJ SOLN
INTRAMUSCULAR | Status: DC | PRN
  Administered 2017-01-29: 10 mg via INTRAVENOUS

## 2017-01-29 MED ORDER — SUGAMMADEX SODIUM 200 MG/2ML IV SOLN
INTRAVENOUS | Status: AC
  Filled 2017-01-29: qty 4

## 2017-01-29 MED ORDER — ONDANSETRON HCL 4 MG PO TABS: 4.0000 mg | ORAL_TABLET | Freq: Four times a day (QID) | ORAL | Status: DC | PRN

## 2017-01-29 MED ORDER — TRAZODONE HCL 50 MG PO TABS
150.0000 mg | ORAL_TABLET | Freq: Every day | ORAL | Status: DC
  Administered 2017-01-29: 150 mg via ORAL
  Filled 2017-01-29: qty 1

## 2017-01-29 MED ORDER — LACTATED RINGERS IV SOLN
INTRAVENOUS | Status: DC
  Administered 2017-01-29 (×2): via INTRAVENOUS

## 2017-01-29 MED ORDER — MIDAZOLAM HCL 2 MG/2ML IJ SOLN
INTRAMUSCULAR | Status: AC
  Filled 2017-01-29: qty 2

## 2017-01-29 MED ORDER — LACTATED RINGERS IV SOLN
INTRAVENOUS | Status: DC
  Administered 2017-01-29: 10:00:00 via INTRAVENOUS

## 2017-01-29 MED ORDER — ACETAMINOPHEN 650 MG RE SUPP: 650.0000 mg | RECTAL | Status: DC | PRN

## 2017-01-29 MED ORDER — HYDROMORPHONE HCL 2 MG/ML IJ SOLN
INTRAMUSCULAR | Status: AC
  Filled 2017-01-29: qty 1

## 2017-01-29 MED ORDER — BUPIVACAINE-EPINEPHRINE (PF) 0.5% -1:200000 IJ SOLN
INTRAMUSCULAR | Status: DC | PRN
  Administered 2017-01-29: 20 mL

## 2017-01-29 MED ORDER — KETOROLAC TROMETHAMINE 30 MG/ML IJ SOLN
INTRAMUSCULAR | Status: AC
  Filled 2017-01-29: qty 1

## 2017-01-29 MED ORDER — METHOCARBAMOL 1000 MG/10ML IJ SOLN
500.0000 mg | Freq: Four times a day (QID) | INTRAVENOUS | Status: DC | PRN
  Filled 2017-01-29: qty 5

## 2017-01-29 MED ORDER — HYDROMORPHONE HCL 1 MG/ML IJ SOLN
0.2500 mg | INTRAMUSCULAR | Status: DC | PRN
Start: 2017-01-29 — End: 2017-01-29
  Administered 2017-01-29 (×3): 0.5 mg via INTRAVENOUS

## 2017-01-29 MED ORDER — ROCURONIUM BROMIDE 100 MG/10ML IV SOLN
INTRAVENOUS | Status: DC | PRN
  Administered 2017-01-29: 20 mg via INTRAVENOUS
  Administered 2017-01-29: 50 mg via INTRAVENOUS

## 2017-01-29 MED ORDER — OXYCODONE-ACETAMINOPHEN 5-325 MG PO TABS: 1.0000 | ORAL_TABLET | ORAL | 0 refills | Status: DC | PRN

## 2017-01-29 MED ORDER — LACTATED RINGERS IV SOLN
INTRAVENOUS | Status: DC
  Administered 2017-01-29: 17:00:00 via INTRAVENOUS

## 2017-01-29 MED ORDER — ONDANSETRON HCL 4 MG/2ML IJ SOLN
INTRAMUSCULAR | Status: AC
  Filled 2017-01-29: qty 2

## 2017-01-29 MED ORDER — ONDANSETRON HCL 4 MG/2ML IJ SOLN
INTRAMUSCULAR | Status: DC | PRN
  Administered 2017-01-29: 4 mg via INTRAVENOUS

## 2017-01-29 MED ORDER — METHOCARBAMOL 500 MG PO TABS: 500.0000 mg | ORAL_TABLET | Freq: Four times a day (QID) | ORAL | 0 refills | Status: DC | PRN

## 2017-01-29 MED ORDER — LORATADINE 10 MG PO TABS
10.0000 mg | ORAL_TABLET | Freq: Every evening | ORAL | Status: DC
  Filled 2017-01-29: qty 1

## 2017-01-29 MED ORDER — ROCURONIUM BROMIDE 50 MG/5ML IV SOSY
PREFILLED_SYRINGE | INTRAVENOUS | Status: AC
  Filled 2017-01-29: qty 5

## 2017-01-29 MED ORDER — BACITRACIN-NEOMYCIN-POLYMYXIN 400-5-5000 EX OINT
TOPICAL_OINTMENT | CUTANEOUS | Status: DC | PRN
Start: 2017-01-29 — End: 2017-01-29
  Administered 2017-01-29: 1 via TOPICAL

## 2017-01-29 MED ORDER — SUGAMMADEX SODIUM 200 MG/2ML IV SOLN
INTRAVENOUS | Status: DC | PRN
  Administered 2017-01-29: 300 mg via INTRAVENOUS

## 2017-01-29 MED ORDER — VENLAFAXINE HCL ER 37.5 MG PO CP24
37.5000 mg | ORAL_CAPSULE | Freq: Every day | ORAL | Status: DC
  Administered 2017-01-30: 37.5 mg via ORAL
  Filled 2017-01-29: qty 1

## 2017-01-29 MED ORDER — ACETAMINOPHEN 325 MG PO TABS: 650.0000 mg | ORAL_TABLET | ORAL | Status: DC | PRN

## 2017-01-29 MED ORDER — BISACODYL 5 MG PO TBEC: 5.0000 mg | DELAYED_RELEASE_TABLET | Freq: Every day | ORAL | Status: DC | PRN

## 2017-01-29 MED ORDER — CHLORHEXIDINE GLUCONATE 4 % EX LIQD: 60.0000 mL | Freq: Once | CUTANEOUS | Status: DC

## 2017-01-29 MED ORDER — LEVOCETIRIZINE DIHYDROCHLORIDE 5 MG PO TABS: 5.0000 mg | ORAL_TABLET | Freq: Every evening | ORAL | Status: DC

## 2017-01-29 MED ORDER — ALPRAZOLAM 0.5 MG PO TABS: 0.5000 mg | ORAL_TABLET | Freq: Three times a day (TID) | ORAL | Status: DC | PRN

## 2017-01-29 MED ORDER — DEXAMETHASONE SODIUM PHOSPHATE 10 MG/ML IJ SOLN
INTRAMUSCULAR | Status: AC
Start: 2017-01-29 — End: 2017-01-29
  Filled 2017-01-29: qty 1

## 2017-01-29 MED ORDER — DEXTROSE 5 % IV SOLN
3.0000 g | Freq: Once | INTRAVENOUS | Status: AC
  Administered 2017-01-29: 3 g via INTRAVENOUS
  Filled 2017-01-29: qty 3

## 2017-01-29 MED ORDER — CEFAZOLIN SODIUM-DEXTROSE 1-4 GM/50ML-% IV SOLN
1.0000 g | Freq: Three times a day (TID) | INTRAVENOUS | Status: DC
  Administered 2017-01-29 – 2017-01-30 (×2): 1 g via INTRAVENOUS
  Filled 2017-01-29 (×3): qty 50

## 2017-01-29 MED ORDER — ONDANSETRON HCL 4 MG/2ML IJ SOLN: 4.0000 mg | Freq: Four times a day (QID) | INTRAMUSCULAR | Status: DC | PRN

## 2017-01-29 MED ORDER — BUPIVACAINE LIPOSOME 1.3 % IJ SUSP
20.0000 mL | Freq: Once | INTRAMUSCULAR | Status: DC
  Filled 2017-01-29: qty 20

## 2017-01-29 MED ORDER — SODIUM CHLORIDE 0.9 % IV SOLN
INTRAVENOUS | Status: DC | PRN
  Administered 2017-01-29: 500 mL

## 2017-01-29 MED ORDER — LISDEXAMFETAMINE DIMESYLATE 70 MG PO CAPS
70.0000 mg | ORAL_CAPSULE | ORAL | Status: DC
  Administered 2017-01-30: 70 mg via ORAL
  Filled 2017-01-29: qty 1

## 2017-01-29 MED ORDER — BUPIVACAINE-EPINEPHRINE (PF) 0.5% -1:200000 IJ SOLN
INTRAMUSCULAR | Status: AC
Start: 2017-01-29 — End: 2017-01-29
  Filled 2017-01-29: qty 30

## 2017-01-29 MED ORDER — HYDROMORPHONE HCL 1 MG/ML IJ SOLN
0.5000 mg | INTRAMUSCULAR | Status: DC | PRN
  Administered 2017-01-29: 0.5 mg via INTRAVENOUS
  Filled 2017-01-29: qty 1

## 2017-01-29 MED ORDER — POLYETHYLENE GLYCOL 3350 17 G PO PACK: 17.0000 g | PACK | Freq: Every day | ORAL | Status: DC | PRN

## 2017-01-29 MED ORDER — OXYCODONE-ACETAMINOPHEN 5-325 MG PO TABS
2.0000 | ORAL_TABLET | ORAL | Status: DC | PRN
  Administered 2017-01-29 – 2017-01-30 (×4): 2 via ORAL
  Filled 2017-01-29 (×4): qty 2

## 2017-01-29 MED ORDER — METHOCARBAMOL 500 MG PO TABS
500.0000 mg | ORAL_TABLET | Freq: Four times a day (QID) | ORAL | Status: DC | PRN
  Administered 2017-01-29: 500 mg via ORAL
  Filled 2017-01-29: qty 1

## 2017-01-29 MED ORDER — BUPIVACAINE LIPOSOME 1.3 % IJ SUSP
INTRAMUSCULAR | Status: DC | PRN
  Administered 2017-01-29: 20 mL

## 2017-01-29 MED ORDER — HYDROMORPHONE HCL 1 MG/ML IJ SOLN
INTRAMUSCULAR | Status: AC
  Administered 2017-01-29: 0.5 mg via INTRAVENOUS
  Filled 2017-01-29: qty 2

## 2017-01-29 MED ORDER — MENTHOL 3 MG MT LOZG: 1.0000 | LOZENGE | OROMUCOSAL | Status: DC | PRN

## 2017-01-29 MED ORDER — SODIUM CHLORIDE 0.9 % IV SOLN
INTRAVENOUS | Status: AC
  Filled 2017-01-29: qty 500000

## 2017-01-29 MED ORDER — HYDROCODONE-ACETAMINOPHEN 5-325 MG PO TABS: 1.0000 | ORAL_TABLET | ORAL | Status: DC | PRN

## 2017-01-29 MED ORDER — KETOROLAC TROMETHAMINE 30 MG/ML IJ SOLN
INTRAMUSCULAR | Status: DC | PRN
  Administered 2017-01-29: 30 mg via INTRAVENOUS

## 2017-01-29 SURGICAL SUPPLY — 49 items
AGENT HMST SPONGE THK3/8 (HEMOSTASIS) ×1
APL SKNCLS STERI-STRIP NONHPOA (GAUZE/BANDAGES/DRESSINGS) ×1
BAG SPEC THK2 15X12 ZIP CLS (MISCELLANEOUS)
BAG ZIPLOCK 12X15 (MISCELLANEOUS) IMPLANT
BENZOIN TINCTURE PRP APPL 2/3 (GAUZE/BANDAGES/DRESSINGS) ×2 IMPLANT
CLEANER TIP ELECTROSURG 2X2 (MISCELLANEOUS) ×2 IMPLANT
COVER SURGICAL LIGHT HANDLE (MISCELLANEOUS) ×2 IMPLANT
DRAIN PENROSE 18X1/4 LTX STRL (WOUND CARE) IMPLANT
DRAPE MICROSCOPE LEICA (MISCELLANEOUS) ×2 IMPLANT
DRAPE POUCH INSTRU U-SHP 10X18 (DRAPES) ×2 IMPLANT
DRAPE SHEET LG 3/4 BI-LAMINATE (DRAPES) ×2 IMPLANT
DRAPE SURG 17X11 SM STRL (DRAPES) ×2 IMPLANT
DRSG ADAPTIC 3X8 NADH LF (GAUZE/BANDAGES/DRESSINGS) ×2 IMPLANT
DRSG PAD ABDOMINAL 8X10 ST (GAUZE/BANDAGES/DRESSINGS) ×7 IMPLANT
DURAPREP 26ML APPLICATOR (WOUND CARE) ×2 IMPLANT
ELECT BLADE TIP CTD 4 INCH (ELECTRODE) ×2 IMPLANT
ELECT REM PT RETURN 15FT ADLT (MISCELLANEOUS) ×2 IMPLANT
GAUZE SPONGE 4X4 12PLY STRL (GAUZE/BANDAGES/DRESSINGS) ×2 IMPLANT
GLOVE BIOGEL PI IND STRL 6.5 (GLOVE) ×1 IMPLANT
GLOVE BIOGEL PI IND STRL 8.5 (GLOVE) ×1 IMPLANT
GLOVE BIOGEL PI INDICATOR 6.5 (GLOVE) ×1
GLOVE BIOGEL PI INDICATOR 8.5 (GLOVE) ×1
GLOVE ECLIPSE 8.0 STRL XLNG CF (GLOVE) ×4 IMPLANT
GLOVE SURG SS PI 6.5 STRL IVOR (GLOVE) ×2 IMPLANT
GOWN STRL REUS W/ TWL LRG LVL3 (GOWN DISPOSABLE) ×1 IMPLANT
GOWN STRL REUS W/TWL LRG LVL3 (GOWN DISPOSABLE) ×2
GOWN STRL REUS W/TWL XL LVL3 (GOWN DISPOSABLE) ×4 IMPLANT
HEMOSTAT SPONGE AVITENE ULTRA (HEMOSTASIS) ×2 IMPLANT
KIT BASIN OR (CUSTOM PROCEDURE TRAY) ×2 IMPLANT
KIT POSITIONING SURG ANDREWS (MISCELLANEOUS) ×2 IMPLANT
MANIFOLD NEPTUNE II (INSTRUMENTS) ×2 IMPLANT
MARKER SKIN DUAL TIP RULER LAB (MISCELLANEOUS) ×2 IMPLANT
NDL SPNL 18GX3.5 QUINCKE PK (NEEDLE) ×2 IMPLANT
NEEDLE HYPO 22GX1.5 SAFETY (NEEDLE) ×4 IMPLANT
NEEDLE SPNL 18GX3.5 QUINCKE PK (NEEDLE) ×4 IMPLANT
PACK LAMINECTOMY ORTHO (CUSTOM PROCEDURE TRAY) ×2 IMPLANT
PATTIES SURGICAL .5 X.5 (GAUZE/BANDAGES/DRESSINGS) ×1 IMPLANT
PATTIES SURGICAL .75X.75 (GAUZE/BANDAGES/DRESSINGS) ×2 IMPLANT
PATTIES SURGICAL 1X1 (DISPOSABLE) ×2 IMPLANT
PIN SAFETY NICK PLATE  2 MED (MISCELLANEOUS)
PIN SAFETY NICK PLATE 2 MED (MISCELLANEOUS) IMPLANT
SPONGE LAP 4X18 X RAY DECT (DISPOSABLE) ×4 IMPLANT
STAPLER VISISTAT 35W (STAPLE) ×2 IMPLANT
SUT VIC AB 1 CT1 27 (SUTURE) ×4
SUT VIC AB 1 CT1 27XBRD ANTBC (SUTURE) ×2 IMPLANT
SUT VIC AB 2-0 CT1 27 (SUTURE) ×4
SUT VIC AB 2-0 CT1 TAPERPNT 27 (SUTURE) ×2 IMPLANT
SYR 20CC LL (SYRINGE) ×4 IMPLANT
TOWEL OR 17X26 10 PK STRL BLUE (TOWEL DISPOSABLE) ×2 IMPLANT

## 2017-01-29 NOTE — Anesthesia Postprocedure Evaluation (Signed)
Anesthesia Post Note  Patient: [name]  Procedure(s) Performed: Procedure(s) (LRB): Central decompression, lumbar laminectomy L5-S1 with microdisectomy (N/A)     Patient location during evaluation: PACU Anesthesia Type: General Level of consciousness: awake and alert Pain management: pain level controlled Vital Signs Assessment: post-procedure vital signs reviewed and stable Respiratory status: spontaneous breathing, nonlabored ventilation, respiratory function stable and patient connected to nasal cannula oxygen Cardiovascular status: blood pressure returned to baseline and stable Postop Assessment: no signs of nausea or vomiting Anesthetic complications: no    Last Vitals:  Vitals:   01/29/17 1430 01/29/17 1445  BP: (!) 120/51 128/66  Pulse: (!) 116 (!) 105  Resp: (!) 27 18  Temp:      Last Pain:  Vitals:   01/29/17 1445  TempSrc:   PainSc: 3                  Aydyn Testerman S

## 2017-01-29 NOTE — Transfer of Care (Signed)
Immediate Anesthesia Transfer of Care Note  Patient: [name]  Procedure(s) Performed: Procedure(s): Central decompression, lumbar laminectomy L5-S1 with microdisectomy (N/A)  Patient Location: PACU  Anesthesia Type:General  Level of Consciousness: awake, alert  and oriented  Airway & Oxygen Therapy: Patient Spontanous Breathing and Patient connected to nasal cannula oxygen  Post-op Assessment: Report given to RN and Post -op Vital signs reviewed and stable  Post vital signs: Reviewed  Last Vitals:  Vitals:   01/29/17 0848  BP: (!) 152/90  Pulse: 98  Resp: 18  Temp: 36.8 C    Last Pain:  Vitals:   01/29/17 0907  TempSrc:   PainSc: 0-No pain      Patients Stated Pain Goal: 3 (01/29/17 0907)  Complications: No apparent anesthesia complications

## 2017-01-29 NOTE — Brief Op Note (Signed)
01/29/2017  2:08 PM  PATIENT:  [name]  36 y.o. female  PRE-OPERATIVE DIAGNOSIS:  Lumbar HNP L5-S1 and Spinal Stenosis and Morbid Obesity  POST-OPERATIVE DIAGNOSIS: Same as Pre-Op  PROCEDURE:  Procedure(s): Central decompression, lumbar laminectomy L5-S1 with microdisectomy (N/A)for Spinal Stenosis and HNP and Foraminotomy for Foraminal Stenosis involving the S-1 Nerve Root.  SURGEON:  Surgeon(s) and Role:    * Ranee GosselinGioffre, Nikash Mortensen, MD - Primary  PHYSICIAN ASSISTANT: Dimitri PedAmber Constable PA  ASSISTANTS: Dimitri PedAmber Constable PA  ANESTHESIA:   general  EBL:  Total I/O In: 1000 [I.V.:1000] Out: -   BLOOD ADMINISTERED:none  DRAINS: none   LOCAL MEDICATIONS USED:  MARCAINE20cc of 0.50% with Epinephrine at the start of the case and Exparel 20cc at the end of the case.     SPECIMEN:  Source of Specimen:  L-5-S-1 interspace  DISPOSITION OF SPECIMEN:  PATHOLOGY  COUNTS:  YES  TOURNIQUET:  * No tourniquets in log *  DICTATION: .Other Dictation: Dictation Number [id number]  PLAN OF CARE: Admit for overnight observation  PATIENT DISPOSITION:  PACU - hemodynamically stable.   Delay start of Pharmacological VTE agent (>24hrs) due to surgical blood loss or risk of bleeding: yes

## 2017-01-29 NOTE — Anesthesia Preprocedure Evaluation (Signed)
Anesthesia Evaluation  Patient identified by MRN, date of birth, ID band Patient awake    Reviewed: Allergy & Precautions, NPO status , Patient's Chart, lab work & pertinent test results  Airway Mallampati: II  TM Distance: <3 FB Neck ROM: Full    Dental no notable dental hx.    Pulmonary neg pulmonary ROS,    Pulmonary exam normal breath sounds clear to auscultation       Cardiovascular negative cardio ROS Normal cardiovascular exam Rhythm:Regular Rate:Normal     Neuro/Psych negative neurological ROS  negative psych ROS   GI/Hepatic negative GI ROS, Neg liver ROS,   Endo/Other  diabetesMorbid obesity  Renal/GU negative Renal ROS  negative genitourinary   Musculoskeletal negative musculoskeletal ROS (+)   Abdominal   Peds negative pediatric ROS (+)  Hematology negative hematology ROS (+)   Anesthesia Other Findings   Reproductive/Obstetrics negative OB ROS                             Anesthesia Physical Anesthesia Plan  ASA: III  Anesthesia Plan: General   Post-op Pain Management:    Induction: Intravenous  PONV Risk Score and Plan: 2 and Ondansetron and Dexamethasone  Airway Management Planned: Oral ETT  Additional Equipment:   Intra-op Plan:   Post-operative Plan: Extubation in OR  Informed Consent: I have reviewed the patients History and Physical, chart, labs and discussed the procedure including the risks, benefits and alternatives for the proposed anesthesia with the patient or authorized representative who has indicated his/her understanding and acceptance.   Dental advisory given  Plan Discussed with: CRNA and Surgeon  Anesthesia Plan Comments:         Anesthesia Quick Evaluation

## 2017-01-29 NOTE — Op Note (Signed)
[name], [name] NO.:  [id number]  MEDICAL RECORD NO.:  [id number]  LOCATION:                                 FACILITY:  PHYSICIAN:  Emily Lynch. Darrelyn Hillock, M.D.     DATE OF BIRTH:  DATE OF PROCEDURE:  01/29/2017 DATE OF DISCHARGE:                              OPERATIVE REPORT   SURGEON:  Emily Lynch. Darrelyn Hillock, M.D.  ASSISTANT:  Dimitri Ped, [place]  PREOPERATIVE DIAGNOSES: 1. Morbid obesity. 2. Extremely large complex herniated disk at L5-S1 on the left. 3. Spinal stenosis, L5-S1. 4. Foraminal stenosis involving the S1 root on the left.  POSTOPERATIVE DIAGNOSES: 1. Morbid obesity. 2. Extremely large complex herniated disk at L5-S1 on the left. 3. Spinal stenosis, L5-S1. 4. Foraminal stenosis involving the S1 root on the left.  Note all her     leg pain was on the left.  OPERATION: 1. Central decompressive lumbar laminectomy at L5-S1. 2. Foraminotomy for the S1 root on the left. 3. Microdiskectomy at L5-S1 for herniated disk at L5-S1.  DESCRIPTION OF PROCEDURE:  Under general anesthesia with the patient on the spinal frame, we initially tried to use the entire spinal frame, but we had to straighten her out because her weight was 354 and the weight at the table recommended no more than 350 in the kneeling-type position, so we just had her straight flattened out for the procedure.  Note, the lady was extremely large.  She had 3 g of IV Ancef.  At that time, after position properly on the table, sterile prep and draping was carried out, appropriate time-out was carried out, also marked the appropriate left side of her back, even though, we were going central, I marked in the holding area.  Two needles were placed in the back for localization purposes, x-ray was taken.  Incision was made over the L5-S1 in the usual fashion.  The incision was extended distally and proximally.  At this time, the bleeders were identified and cauterized.   Self-retaining retractors were inserted.  Note, we had to take a great deal of time to get down through the subcutaneous tissue to get to these spinous processes.  Once we did this, we then stripped the muscle from the spinous processes and lamina bilaterally.  Another x-ray was taken to verify the L5-S1 space.  We then began our decompression centrally, she was extremely tight.  You can imagine because of her weight, she was 354 pounds.  We went down and did a central decompressive.  I then brought the microscope in and identified the ligamentum flavum and removed the ligamentum flavum with great care taken not to injure the underlying dura.  At this particular time, we then decompressed the lateral recess on the left because this nerve root was extremely swollen, it literally was about two times its normal diameter.  Once we freed up the lateral recess, we were able to easily identify the root.  I did a foraminotomy for the S1 root distally to make sure we had good mobilization without stretch in the root.  We then went out laterally and medially upon using the Penfield 4, a small piece of  disk initially came up.  Then, I retracted the root and then utilized the pituitary rongeur and removed a large piece of disk; literally, it was about the half of the length of a pump.  The disk was extremely large, it was sent to the lab.  Note, I then made a cruciate incision in the posterior longitudinal ligament and completed my diskectomy.  I utilized the nerve hook and the Epstein curette to go out laterally, distally, etc. to make sure that the root was now free and the dura was free.  There were no other fragments noted.  I thoroughly irrigated out the area, loosely applied some Ultrafoam, Gelfoam and then closed the wound layers in usual fashion. Note at the beginning of the case, I injected 20 mL of 0.5% Marcaine with epinephrine.  At the end of the case, I injected 20 mL of Exparel. I did  leave a deep small distal and proximal part of the wound open for drainage purposes because I expected to have some bleeding from the muscle.  We then closed the remaining part of the wound in usual fashion.  Skin was closed with metal staples.  Sterile dressings were applied.  The patient will be kept overnight.          ______________________________ [name], M.D.     RAG/MEDQ  D:  01/29/2017  T:  01/29/2017  Job:  [id number]

## 2017-01-29 NOTE — Anesthesia Procedure Notes (Signed)
Procedure Name: Intubation Date/Time: 01/29/2017 11:47 AM Performed by: Chandra Batch A Pre-anesthesia Checklist: Patient identified, Emergency Drugs available, Suction available, Patient being monitored and Timeout performed Patient Re-evaluated:Patient Re-evaluated prior to inductionOxygen Delivery Method: Circle system utilized Preoxygenation: Pre-oxygenation with 100% oxygen Intubation Type: IV induction Ventilation: Mask ventilation without difficulty and Oral airway inserted - appropriate to patient size Laryngoscope Size: Mac and 3 Grade View: Grade II Tube type: Oral Tube size: 8.0 mm Number of attempts: 1 Airway Equipment and Method: Stylet Placement Confirmation: ETT inserted through vocal cords under direct vision,  positive ETCO2 and breath sounds checked- equal and bilateral Secured at: 22 cm Tube secured with: Tape Dental Injury: Teeth and Oropharynx as per pre-operative assessment

## 2017-01-29 NOTE — Discharge Instructions (Signed)
For the first three days, remove your dressing, tape a piece of saran wrap over your incision °Take your shower, then remove the saran wrap and put a clean dressing on °After three days you can shower without the saran wrap.  °No lifting or driving  °Call Dr. Gioffre if any wound complications or temperature of 101 degrees F or over.  °Call the office for an appointment to see Dr. Gioffre in two weeks: [phone] and ask for Dr. Gioffre's nurse, Tammy Johnson.  °

## 2017-01-30 DIAGNOSIS — M5127 Other intervertebral disc displacement, lumbosacral region: Secondary | ICD-10-CM | POA: Diagnosis not present

## 2017-01-30 DIAGNOSIS — M79605 Pain in left leg: Secondary | ICD-10-CM | POA: Diagnosis not present

## 2017-01-30 DIAGNOSIS — M48061 Spinal stenosis, lumbar region without neurogenic claudication: Secondary | ICD-10-CM | POA: Diagnosis not present

## 2017-01-30 DIAGNOSIS — M4807 Spinal stenosis, lumbosacral region: Secondary | ICD-10-CM | POA: Diagnosis not present

## 2017-01-30 DIAGNOSIS — Z7984 Long term (current) use of oral hypoglycemic drugs: Secondary | ICD-10-CM | POA: Diagnosis not present

## 2017-01-30 DIAGNOSIS — Z6841 Body Mass Index (BMI) 40.0 and over, adult: Secondary | ICD-10-CM | POA: Diagnosis not present

## 2017-01-30 DIAGNOSIS — G4730 Sleep apnea, unspecified: Secondary | ICD-10-CM | POA: Diagnosis not present

## 2017-01-30 DIAGNOSIS — F329 Major depressive disorder, single episode, unspecified: Secondary | ICD-10-CM | POA: Diagnosis not present

## 2017-01-30 DIAGNOSIS — E119 Type 2 diabetes mellitus without complications: Secondary | ICD-10-CM | POA: Diagnosis not present

## 2017-01-30 DIAGNOSIS — F419 Anxiety disorder, unspecified: Secondary | ICD-10-CM | POA: Diagnosis not present

## 2017-01-30 DIAGNOSIS — M5126 Other intervertebral disc displacement, lumbar region: Secondary | ICD-10-CM | POA: Diagnosis not present

## 2017-01-30 LAB — GLUCOSE, CAPILLARY: Glucose-Capillary: 120 mg/dL — ABNORMAL HIGH (ref 65–99)

## 2017-01-30 NOTE — Progress Notes (Signed)
Subjective: 1 Day Post-Op Procedure(s) (LRB): Central decompression, lumbar laminectomy L5-S1 with microdisectomy (N/A) Patient reports pain as 0 on 0-10 scale.Doing very well this morning. No further leg pain. Will DC.    Objective: Vital signs in last 24 hours: Temp:  [98 F (36.7 C)-99 F (37.2 C)] 98.4 F (36.9 C) (06/21 0532) Pulse Rate:  [79-118] 79 (06/21 0532) Resp:  [12-27] 18 (06/21 0532) BP: (120-164)/(51-105) 155/74 (06/21 0532) SpO2:  [92 %-100 %] 100 % (06/21 0532) Weight:  [158.3 kg (349 lb)] 158.3 kg (349 lb) (06/20 0907)  Intake/Output from previous day: 06/20 0701 - 06/21 0700 In: 3822.9 [P.O.:420; I.V.:3252.9; IV Piggyback:150] Out: 900 [Urine:800; Blood:100] Intake/Output this shift: No intake/output data recorded.  No results for input(s): HGB in the last 72 hours. No results for input(s): WBC, RBC, HCT, PLT in the last 72 hours. No results for input(s): NA, K, CL, CO2, BUN, CREATININE, GLUCOSE, CALCIUM in the last 72 hours. No results for input(s): LABPT, INR in the last 72 hours.  Neurologically intact  Assessment/Plan: 1 Day Post-Op Procedure(s) (LRB): Central decompression, lumbar laminectomy L5-S1 with microdisectomy (N/A) Will DC home today.  Annessa Satre A 01/30/2017, 7:13 AM

## 2017-01-30 NOTE — Progress Notes (Signed)
RN reviewed discharge instructions with patient and family. All questions answered.   RN reviewed dressing changes, showering, signs and symptoms of infection, and pain medications.   Paperwork and prescriptions given.   NT rolled patient down with all belongings to family car.

## 2017-01-30 NOTE — Evaluation (Signed)
Occupational Therapy Evaluation Patient Details Name: KOURTNEI RAUBER MRN: [id number] DOB: July 03, 1981 Today's Date: 01/30/2017    History of Present Illness Central decompression, lumbar laminectomy L5-S1 with microdisectomy    Clinical Impression   OT education complete regarding ADL activity and back precautions    Follow Up Recommendations  No OT follow up    Equipment Recommendations  None recommended by OT    Recommendations for Other Services       Precautions / Restrictions Precautions Precautions: Back Restrictions Weight Bearing Restrictions: No      Mobility Bed Mobility Overal bed mobility: Modified Independent             General bed mobility comments: Instruction andhandout provided regarding bed mobility  Transfers Overall transfer level: Modified independent               General transfer comment: VC for back precautions    Balance                                           ADL either performed or assessed with clinical judgement   ADL Overall ADL's : Needs assistance/impaired                                       General ADL Comments: Pt S- min A with ADL activity .  Educated on back precautions, AE, techniques to perform ADL activity.      Vision Baseline Vision/History: No visual deficits              Pertinent Vitals/Pain Pain Assessment: 0-10 Pain Score: 2  Pain Location: back Pain Descriptors / Indicators: Sore Pain Intervention(s): Monitored during session;Limited activity within patient's tolerance     Hand Dominance     Extremity/Trunk Assessment Upper Extremity Assessment Upper Extremity Assessment: Overall WFL for tasks assessed           Communication Communication Communication: No difficulties   Cognition Arousal/Alertness: Awake/alert Behavior During Therapy: WFL for tasks assessed/performed Overall Cognitive Status: Within Functional Limits for tasks assessed                                        Exercises     Shoulder Instructions      Home Living Family/patient expects to be discharged to:: Private residence Living Arrangements: Parent Available Help at Discharge: Family Type of Home: House Home Access: Stairs to enter Secretary/administrator of Steps: 1 Entrance Stairs-Rails: Can reach both Home Layout: One level     Bathroom Shower/Tub: Tub/shower unit         Home Equipment: None          Prior Functioning/Environment Level of Independence: Independent                          OT Goals(Current goals can be found in the care plan section) Acute Rehab OT Goals Patient Stated Goal: no pain OT Goal Formulation: With patient  OT Frequency:     Barriers to D/C:               AM-PAC PT "6 Clicks" Daily Activity     Outcome Measure Help from another  person eating meals?: None Help from another person taking care of personal grooming?: None Help from another person toileting, which includes using toliet, bedpan, or urinal?: A Little Help from another person bathing (including washing, rinsing, drying)?: A Little Help from another person to put on and taking off regular upper body clothing?: None Help from another person to put on and taking off regular lower body clothing?: A Little 6 Click Score: 21   End of Session Nurse Communication: Mobility status  Activity Tolerance: Patient tolerated treatment well Patient left: in chair;with call bell/phone within reach;with family/visitor present  OT Visit Diagnosis: Muscle weakness (generalized) (M62.81)                Time: 7829-56210945-1018 OT Time Calculation (min): 33 min Charges:  OT General Charges $OT Visit: 1 Procedure OT Evaluation $OT Eval Low Complexity: 1 Procedure OT Treatments $Self Care/Home Management : 8-22 mins G-Codes: OT G-codes **NOT FOR INPATIENT CLASS** Functional Assessment Tool Used: Clinical judgement Functional Limitation:  Self care Self Care Current Status (H0865(G8987): At least 1 percent but less than 20 percent impaired, limited or restricted Self Care Goal Status (H8469(G8988): 0 percent impaired, limited or restricted Self Care Discharge Status [phone](G8989): At least 1 percent but less than 20 percent impaired, limited or restricted   Lise AuerLori Xachary [place], [place] [phone]  Einar CrowEDDING, [place] D 01/30/2017, 10:44 AM

## 2017-01-30 NOTE — Evaluation (Signed)
Physical Therapy Evaluation Patient Details Name: [name] J Stead MRN: [id number] DOB: Jul 06, 1981 Today's Date: 01/30/2017   History of Present Illness  Central decompression, lumbar laminectomy L5-S1 with microdisectomy   Clinical Impression  Patient evaluated by Physical Therapy with no further acute PT needs identified. All education has been completed and the patient has no further questions. * See below for any follow-up Physical Therapy or equipment needs. PT is signing off. Thank you for this referral.     Follow Up Recommendations No PT follow up    Equipment Recommendations  None recommended by PT    Recommendations for Other Services       Precautions / Restrictions Precautions Precautions: Back      Mobility  Bed Mobility               General bed mobility comments: performed with OT--mod I, reviewed technique as well as support and sleeping positions; pt and her mother verbalized techniques  Transfers Overall transfer level: Needs assistance   Transfers: Sit to/from Stand Sit to Stand: Supervision;Modified independent (Device/Increase time)         General transfer comment: VC for back precautions initially  Ambulation/Gait     Assistive device: None Gait Pattern/deviations: Step-through pattern;WFL(Within Functional Limits)        Stairs Stairs: Yes Stairs assistance: Min guard Stair Management: No rails;Forwards Number of Stairs: 1 General stair comments: HHA for safety  Wheelchair Mobility    Modified Rankin (Stroke Patients Only)       Balance                                             Pertinent Vitals/Pain Pain Assessment: 0-10 Pain Score: 2  Pain Location: back Pain Descriptors / Indicators: Sore Pain Intervention(s): Limited activity within patient's tolerance;Monitored during session    Home Living Family/patient expects to be discharged to:: Private residence Living Arrangements:  Parent Available Help at Discharge: Family Type of Home: House Home Access: Stairs to enter   Secretary/administratorntrance Stairs-Number of Steps: 1 Home Layout: One level Home Equipment: None      Prior Function Level of Independence: Independent               Hand Dominance        Extremity/Trunk Assessment   Upper Extremity Assessment Upper Extremity Assessment: Overall WFL for tasks assessed;Defer to OT evaluation    Lower Extremity Assessment Lower Extremity Assessment: Overall WFL for tasks assessed;LLE deficits/detail LLE Deficits / Details: baseline LLE pain and weakness; pt denies N/T, strength WFL per functional tasks performed       Communication      Cognition Arousal/Alertness: Awake/alert Behavior During Therapy: WFL for tasks assessed/performed Overall Cognitive Status: Within Functional Limits for tasks assessed                                        General Comments      Exercises     Assessment/Plan    PT Assessment Patent does not need any further PT services  PT Problem List         PT Treatment Interventions      PT Goals (Current goals can be found in the Care Plan section)  Acute Rehab PT Goals Patient Stated Goal: no pain  PT Goal Formulation: All assessment and education complete, DC therapy    Frequency     Barriers to discharge        Co-evaluation               AM-PAC PT "6 Clicks" Daily Activity  Outcome Measure Difficulty turning over in bed (including adjusting bedclothes, sheets and blankets)?: None Difficulty moving from lying on back to sitting on the side of the bed? : None Difficulty sitting down on and standing up from a chair with arms (e.g., wheelchair, bedside commode, etc,.)?: None Help needed moving to and from a bed to chair (including a wheelchair)?: None Help needed walking in hospital room?: None Help needed climbing 3-5 steps with a railing? : A Little 6 Click Score: 23    End of Session  Equipment Utilized During Treatment: Gait belt Activity Tolerance: Patient tolerated treatment well Patient left: in chair;with call bell/phone within reach;with family/visitor present   PT Visit Diagnosis: Other abnormalities of gait and mobility (R26.89)    Time: 1610-9604 PT Time Calculation (min) (ACUTE ONLY): 11 min   Charges:   PT Evaluation $PT Eval Low Complexity: 1 Procedure     PT G Codes:   PT G-Codes **NOT FOR INPATIENT CLASS** Functional Assessment Tool Used: AM-PAC 6 Clicks Basic Mobility;Clinical judgement Functional Limitation: Mobility: Walking and moving around Mobility: Walking and Moving Around Current Status ([id number]): At least 1 percent but less than 20 percent impaired, limited or restricted Mobility: Walking and Moving Around Goal Status [phone]): At least 1 percent but less than 20 percent impaired, limited or restricted Mobility: Walking and Moving Around Discharge Status [phone]): At least 1 percent but less than 20 percent impaired, limited or restricted      [facility] 01/30/2017, 3:08 PM

## 2017-02-03 NOTE — Discharge Summary (Signed)
Physician Discharge Summary   Patient ID: [name] MRN: [id number] DOB/AGE: Oct 23, 1980 36 y.o.  Admit date: 01/29/2017 Discharge date: 01/30/2017  Primary Diagnosis: Lumbar disc herniation L5-S1  Admission Diagnoses:  Past Medical History:  Diagnosis Date  . Anxiety   . Carpal tunnel syndrome on left   . Depression   . Diabetes mellitus without complication ([place])    type 2 on metformin since april  . Lumbar spinal stenosis   . Sleep apnea    no cpap used   Discharge Diagnoses:   Active Problems:   Herniated intervertebral disc of lumbar spine  Estimated body mass index is 51.54 kg/m as calculated from the following:   Height as of this encounter: _0  (1.753 m).   Weight as of this encounter: 158.3 kg (349 lb).  Procedure:  Procedure(s) (LRB): Central decompression, lumbar laminectomy L5-S1 with microdisectomy (N/A)   Consults: None  HPI: Dashanna presented with a chief complaint of low back pain and left leg pain. At the time of presentation she said that she has had this for about a week. She said that she was doing some stretching and marching in place with an exercise program and noticed discomfort the next day. She says that she is getting pain in the low back and buttocks. It does radiate down the leg. No groin pain, no pain in the knees, no numbness or tingling, no history of low back or hip problems. She has not seen improvement with rest, anti-inflammatories, ice or heat. No change in bladder or bowel function. MRI showed a large herniated disc a L5-S1 on the left.   Laboratory Data: Admission on 01/29/2017, Discharged on 01/30/2017  Component Date Value Ref Range Status  . Glucose-Capillary 01/29/2017 141* 65 - 99 mg/dL Final  . Comment 1 01/29/2017 Notify RN   Final  . Glucose-Capillary 01/29/2017 150* 65 - 99 mg/dL Final  . Comment 1 01/29/2017 Document in Chart   Final  . Glucose-Capillary 01/29/2017 145* 65 - 99 mg/dL Final  . Comment 1 01/29/2017 Notify  RN   Final  . Comment 2 01/29/2017 Document in Chart   Final  . Glucose-Capillary 01/29/2017 167* 65 - 99 mg/dL Final  . Glucose-Capillary 01/30/2017 120* 65 - 99 mg/dL Final  Hospital Outpatient Visit on 01/23/2017  Component Date Value Ref Range Status  . Glucose-Capillary 01/23/2017 110* 65 - 99 mg/dL Final  . Preg, Serum 01/23/2017 NEGATIVE  NEGATIVE Final   Comment:        THE SENSITIVITY OF THIS METHODOLOGY IS >10 mIU/mL.   Marland Kitchen aPTT 01/23/2017 30  24 - 36 seconds Final  . WBC 01/23/2017 7.9  4.0 - 10.5 K/uL Final  . RBC 01/23/2017 4.04  3.87 - 5.11 MIL/uL Final  . Hemoglobin 01/23/2017 12.1  12.0 - 15.0 g/dL Final  . HCT 01/23/2017 36.4  36.0 - 46.0 % Final  . MCV 01/23/2017 90.1  78.0 - 100.0 fL Final  . MCH 01/23/2017 30.0  26.0 - 34.0 pg Final  . MCHC 01/23/2017 33.2  30.0 - 36.0 g/dL Final  . RDW 01/23/2017 13.0  11.5 - 15.5 % Final  . Platelets 01/23/2017 322  150 - 400 K/uL Final  . Neutrophils Relative % 01/23/2017 55  % Final  . Neutro Abs 01/23/2017 4.4  1.7 - 7.7 K/uL Final  . Lymphocytes Relative 01/23/2017 37  % Final  . Lymphs Abs 01/23/2017 2.9  0.7 - 4.0 K/uL Final  . Monocytes Relative 01/23/2017 5  %  Final  . Monocytes Absolute 01/23/2017 0.4  0.1 - 1.0 K/uL Final  . Eosinophils Relative 01/23/2017 2  % Final  . Eosinophils Absolute 01/23/2017 0.2  0.0 - 0.7 K/uL Final  . Basophils Relative 01/23/2017 1  % Final  . Basophils Absolute 01/23/2017 0.0  0.0 - 0.1 K/uL Final  . Sodium 01/23/2017 139  135 - 145 mmol/L Final  . Potassium 01/23/2017 3.8  3.5 - 5.1 mmol/L Final  . Chloride 01/23/2017 103  101 - 111 mmol/L Final  . CO2 01/23/2017 27  22 - 32 mmol/L Final  . Glucose, Bld 01/23/2017 112* 65 - 99 mg/dL Final  . BUN 01/23/2017 14  6 - 20 mg/dL Final  . Creatinine, Ser 01/23/2017 1.05* 0.44 - 1.00 mg/dL Final  . Calcium 01/23/2017 9.6  8.9 - 10.3 mg/dL Final  . Total Protein 01/23/2017 7.3  6.5 - 8.1 g/dL Final  . Albumin 01/23/2017 4.2  3.5 - 5.0 g/dL  Final  . AST 01/23/2017 23  15 - 41 U/L Final  . ALT 01/23/2017 28  14 - 54 U/L Final  . Alkaline Phosphatase 01/23/2017 47  38 - 126 U/L Final  . Total Bilirubin 01/23/2017 0.5  0.3 - 1.2 mg/dL Final  . GFR calc non Af Amer 01/23/2017 >60  >60 mL/min Final  . GFR calc Af Amer 01/23/2017 >60  >60 mL/min Final   Comment: (NOTE) The eGFR has been calculated using the CKD EPI equation. This calculation has not been validated in all clinical situations. eGFR's persistently <60 mL/min signify possible Chronic Kidney Disease.   . Anion gap 01/23/2017 9  5 - 15 Final  . Prothrombin Time 01/23/2017 13.1  11.4 - 15.2 seconds Final  . INR 01/23/2017 0.99   Final  . Color, Urine 01/23/2017 YELLOW  YELLOW Final  . APPearance 01/23/2017 CLEAR  CLEAR Final  . Specific Gravity, Urine 01/23/2017 1.020  1.005 - 1.030 Final  . pH 01/23/2017 6.0  5.0 - 8.0 Final  . Glucose, UA 01/23/2017 NEGATIVE  NEGATIVE mg/dL Final  . Hgb urine dipstick 01/23/2017 NEGATIVE  NEGATIVE Final  . Bilirubin Urine 01/23/2017 NEGATIVE  NEGATIVE Final  . Ketones, ur 01/23/2017 NEGATIVE  NEGATIVE mg/dL Final  . Protein, ur 01/23/2017 NEGATIVE  NEGATIVE mg/dL Final  . Nitrite 01/23/2017 NEGATIVE  NEGATIVE Final  . Leukocytes, UA 01/23/2017 NEGATIVE  NEGATIVE Final   Microscopic not done on urines with negative protein, blood, leukocytes, nitrite, or glucose < 500 mg/dL.  Marland Kitchen MRSA, PCR 01/23/2017 NEGATIVE  NEGATIVE Final  . Staphylococcus aureus 01/23/2017 NEGATIVE  NEGATIVE Final   Comment:        The Xpert SA Assay (FDA approved for NASAL specimens in patients over 53 years of age), is one component of a comprehensive surveillance program.  Test performance has been validated by [facility] for patients greater than or equal to 47 year old. It is not intended to diagnose infection nor to guide or monitor treatment.   . Hgb A1c MFr Bld 01/23/2017 5.5  4.8 - 5.6 % Final   Comment: (NOTE)         Pre-diabetes: 5.7 -  6.4         Diabetes: >6.4         Glycemic control for adults with diabetes: <7.0   . Mean Plasma Glucose 01/23/2017 111  mg/dL Final   Comment: (NOTE) Performed At: [facility] [place], [place] [id number] Lindon Romp MD JZ:[id number]  X-Rays:Dg Chest 2 View  Result Date: 01/23/2017 CLINICAL DATA:  Preoperative evaluation for lumbar laminectomy EXAM: CHEST  2 VIEW COMPARISON:  None. FINDINGS: Lungs are clear. Heart size and pulmonary vascularity are normal. No adenopathy. No bone lesions. IMPRESSION: No edema or consolidation. Electronically Signed   By: Lowella Grip III M.D.   On: 01/23/2017 10:44   Dg Lumbar Spine 2-3 Views  Addendum Date: 01/29/2017   ADDENDUM REPORT: 01/29/2017 08:47 ADDENDUM: Lumbar vertebral body levels numbered. Electronically Signed   By: Lowella Grip III M.D.   On: 01/29/2017 08:47   Result Date: 01/29/2017 CLINICAL DATA:  Lumbago EXAM: LUMBAR SPINE - 2-3 VIEW COMPARISON:  None. FINDINGS: Frontal, lateral, and spot lumbosacral lateral images were obtained. There are 5 non-rib-bearing lumbar type vertebral bodies. There is no fracture or spondylolisthesis. There is moderate disc space narrowing at L5-S1. Other disc spaces appear normal. No erosive change. There is a lap band present. IMPRESSION: Moderate disc space narrowing L5-S1. Other disc spaces appear unremarkable. No fracture or spondylolisthesis. Electronically Signed: By: Lowella Grip III M.D. On: 01/23/2017 10:46   Dg Spine Portable 1 View  Result Date: 01/29/2017 CLINICAL DATA:  Central decompression L5-S1 EXAM: PORTABLE SPINE - 1 VIEW COMPARISON:  01/23/2017 FINDINGS: Posterior surgical instrument is directed at the L5-S1 level. IMPRESSION: Intraoperative localization as above. Electronically Signed   By: Rolm Baptise M.D.   On: 01/29/2017 13:24   Dg Spine Portable 1 View  Result Date: 01/29/2017 CLINICAL DATA:  Lateral localization radiograph for lumbar  laminectomy at L5-S1. This images labeled #2. EXAM: PORTABLE SPINE - 1 VIEW COMPARISON:  Earlier lateral localization radiograph of the lumbar spine. FINDINGS: The upper surgical instrument overlies the inferior tip of the spinous process of L4 and is 6 cm posterior to the posterior cortex of the body of L5. The lower instrument lies 5.9 cm posterior to the L5-S1 disc space. IMPRESSION: Lateral localization radiographs with findings as described. Electronically Signed   By: David  Martinique M.D.   On: 01/29/2017 12:59   Dg Spine Portable 1 View  Addendum Date: 01/29/2017   ADDENDUM REPORT: 01/29/2017 12:45 ADDENDUM: The lumbar levels were erroneously labeled labeled as T3 through T5 when in fact they should be L3 through L5. The images have been corrected. Electronically Signed   By: David  Martinique M.D.   On: 01/29/2017 12:45   Result Date: 01/29/2017 CLINICAL DATA:  Localization image prior lumbar laminectomy at L5-S1. EXAM: PORTABLE SPINE - 1 VIEW COMPARISON:  Lumbar spine series of January 23, 2017 FINDINGS: The numbering scheme is in accordance with the previous study. The upper metallic needle lies approximately 12 cm posterior to fthe posterior aspect of the L5 vertebral body. The lower metallic needle lies approximately 9.4 cm posterior to the posterosuperior aspect of S1. IMPRESSION: Lateral lumbar spine localization radiographs with the metallic needles as described. Electronically Signed: By: David  Martinique M.D. On: 01/29/2017 12:36     Hospital Course: SANJUANA MRUK is a 36 y.o. who was admitted to [facility]. They were brought to the operating room on 01/29/2017 and underwent Procedure(s): Central decompression, lumbar laminectomy L5-S1 with microdisectomy.  Patient tolerated the procedure well and was later transferred to the recovery room and then to the orthopaedic floor for postoperative care.  They were given PO and IV analgesics for pain control following their surgery.  They were given  24 hours of postoperative antibiotics of  Anti-infectives    Start     Dose/Rate  Route Frequency Ordered Stop   01/29/17 1800  ceFAZolin (ANCEF) IVPB 1 g/50 mL premix  Status:  Discontinued     1 g 100 mL/hr over 30 Minutes Intravenous Every 8 hours 01/29/17 1604 01/30/17 1401   01/29/17 1352  polymyxin B 500,000 Units, bacitracin 50,000 Units in sodium chloride 0.9 % 500 mL irrigation  Status:  Discontinued       As needed 01/29/17 1352 01/29/17 1421   01/29/17 1100  ceFAZolin (ANCEF) 3 g in dextrose 5 % 50 mL IVPB     3 g 130 mL/hr over 30 Minutes Intravenous  Once 01/29/17 1047 01/29/17 1152     and started on DVT prophylaxis in the form of Aspirin.   PT was ordered to ambulate the patient.  Discharge planning consulted to help with postop disposition and equipment needs.  Patient had a good night on the evening of surgery.  They started to get up OOB with therapy on day one. She saw improvement in her low back and left leg pain.  Dressing was changed and the incision was clean and dry.  The patient had progressed with therapy and meeting their goals.  Incision was healing well.  Patient was seen in rounds and was ready to go home.   Diet: Cardiac diet and Diabetic diet Activity:WBAT Follow-up:in 2 weeks Disposition - Home Discharged Condition: stable   Discharge Instructions    Call MD / Call 911    Complete by:  As directed    If you experience chest pain or shortness of breath, CALL 911 and be transported to the hospital emergency room.  If you develope a fever above 101 F, pus (white drainage) or increased drainage or redness at the wound, or calf pain, call your surgeon's office.   Constipation Prevention    Complete by:  As directed    Drink plenty of fluids.  Prune juice may be helpful.  You may use a stool softener, such as Colace (over the counter) 100 mg twice a day.  Use MiraLax (over the counter) for constipation as needed.   Diet - low sodium heart healthy    Complete by:   As directed    Diet Carb Modified    Complete by:  As directed    Discharge instructions    Complete by:  As directed    For the first three days, remove your dressing, tape a piece of saran wrap over your incision Take your shower, then remove the saran wrap and put a clean dressing on After three days you can shower without the saran wrap.  No lifting or driving.  Call Dr. Gladstone Lighter if any wound complications or temperature of 101 degrees F or over.  Call the office for an appointment to see Dr. Gladstone Lighter in two weeks: [phone] and ask for Dr. Charlestine Night nurse, Brunilda Payor.   Increase activity slowly as tolerated    Complete by:  As directed      Allergies as of 01/30/2017   No Known Allergies     Medication List    TAKE these medications   ALPRAZolam 0.5 MG tablet Commonly known as:  XANAX Take 0.5 mg by mouth 3 (three) times daily as needed for anxiety.   desvenlafaxine 50 MG 24 hr tablet Commonly known as:  PRISTIQ Take 50 mg by mouth daily. Notes to patient:  Home regimen   folic acid 170 MCG tablet Commonly known as:  FOLVITE Take 400 mcg by mouth daily. Notes to patient:  Home regimen   ibuprofen 800 MG tablet Commonly known as:  ADVIL,MOTRIN Take 800 mg by mouth every 8 (eight) hours as needed for mild pain or moderate pain.   levocetirizine 5 MG tablet Commonly known as:  XYZAL Take 5 mg by mouth every evening.   lisdexamfetamine 70 MG capsule Commonly known as:  VYVANSE Take 70 mg by mouth every morning.   metFORMIN 500 MG tablet Commonly known as:  GLUCOPHAGE Take 1 tablet by mouth 2 (two) times daily with a meal.   methocarbamol 500 MG tablet Commonly known as:  ROBAXIN Take 1 tablet (500 mg total) by mouth every 6 (six) hours as needed for muscle spasms.   montelukast 10 MG tablet Commonly known as:  SINGULAIR Take 10 mg by mouth at bedtime.   multivitamin with minerals tablet Take 1 tablet by mouth daily. Notes to patient:  Home regimen    oxyCODONE-acetaminophen 5-325 MG tablet Commonly known as:  PERCOCET/ROXICET Take 1-2 tablets by mouth every 4 (four) hours as needed for moderate pain.   traZODone 50 MG tablet Commonly known as:  DESYREL Take 150 mg by mouth at bedtime.      Follow-up Information    Latanya Maudlin, MD. Schedule an appointment as soon as possible for a visit in 2 week(s).   Specialty:  Orthopedic Surgery Contact information: [address] [place] [address] [phone]           Signed: Ardeen Jourdain, PA-C Orthopaedic Surgery 02/03/2017, 9:09 AM

## 2017-02-14 DIAGNOSIS — Z6841 Body Mass Index (BMI) 40.0 and over, adult: Secondary | ICD-10-CM | POA: Diagnosis not present

## 2017-02-14 DIAGNOSIS — Z9884 Bariatric surgery status: Secondary | ICD-10-CM | POA: Diagnosis not present

## 2017-02-14 DIAGNOSIS — Z7984 Long term (current) use of oral hypoglycemic drugs: Secondary | ICD-10-CM | POA: Diagnosis not present

## 2017-02-14 DIAGNOSIS — Z713 Dietary counseling and surveillance: Secondary | ICD-10-CM | POA: Diagnosis not present

## 2017-02-14 DIAGNOSIS — R7303 Prediabetes: Secondary | ICD-10-CM | POA: Diagnosis not present

## 2017-02-14 DIAGNOSIS — E559 Vitamin D deficiency, unspecified: Secondary | ICD-10-CM | POA: Diagnosis not present

## 2017-02-14 DIAGNOSIS — D649 Anemia, unspecified: Secondary | ICD-10-CM | POA: Diagnosis not present

## 2017-02-20 DIAGNOSIS — M5106 Intervertebral disc disorders with myelopathy, lumbar region: Secondary | ICD-10-CM | POA: Diagnosis not present

## 2017-02-20 DIAGNOSIS — Z6841 Body Mass Index (BMI) 40.0 and over, adult: Secondary | ICD-10-CM | POA: Diagnosis not present

## 2017-02-20 DIAGNOSIS — Z09 Encounter for follow-up examination after completed treatment for conditions other than malignant neoplasm: Secondary | ICD-10-CM | POA: Diagnosis not present

## 2017-03-20 DIAGNOSIS — R635 Abnormal weight gain: Secondary | ICD-10-CM | POA: Diagnosis not present

## 2017-03-20 DIAGNOSIS — E611 Iron deficiency: Secondary | ICD-10-CM | POA: Diagnosis not present

## 2017-03-20 DIAGNOSIS — R7303 Prediabetes: Secondary | ICD-10-CM | POA: Diagnosis not present

## 2017-03-24 DIAGNOSIS — F419 Anxiety disorder, unspecified: Secondary | ICD-10-CM | POA: Diagnosis not present

## 2017-03-24 DIAGNOSIS — F54 Psychological and behavioral factors associated with disorders or diseases classified elsewhere: Secondary | ICD-10-CM | POA: Diagnosis not present

## 2017-03-24 DIAGNOSIS — Z6841 Body Mass Index (BMI) 40.0 and over, adult: Secondary | ICD-10-CM | POA: Diagnosis not present

## 2017-03-28 DIAGNOSIS — M545 Low back pain: Secondary | ICD-10-CM | POA: Diagnosis not present

## 2017-04-07 DIAGNOSIS — F900 Attention-deficit hyperactivity disorder, predominantly inattentive type: Secondary | ICD-10-CM | POA: Diagnosis not present

## 2017-04-07 DIAGNOSIS — F3342 Major depressive disorder, recurrent, in full remission: Secondary | ICD-10-CM | POA: Diagnosis not present

## 2017-04-07 DIAGNOSIS — F410 Panic disorder [episodic paroxysmal anxiety] without agoraphobia: Secondary | ICD-10-CM | POA: Diagnosis not present

## 2017-04-08 DIAGNOSIS — Z6841 Body Mass Index (BMI) 40.0 and over, adult: Secondary | ICD-10-CM | POA: Diagnosis not present

## 2017-04-08 DIAGNOSIS — Z713 Dietary counseling and surveillance: Secondary | ICD-10-CM | POA: Diagnosis not present

## 2017-04-11 DIAGNOSIS — M545 Low back pain: Secondary | ICD-10-CM | POA: Diagnosis not present

## 2017-05-06 DIAGNOSIS — Z124 Encounter for screening for malignant neoplasm of cervix: Secondary | ICD-10-CM | POA: Diagnosis not present

## 2017-05-06 DIAGNOSIS — L682 Localized hypertrichosis: Secondary | ICD-10-CM | POA: Diagnosis not present

## 2017-05-06 DIAGNOSIS — Z01419 Encounter for gynecological examination (general) (routine) without abnormal findings: Secondary | ICD-10-CM | POA: Diagnosis not present

## 2017-05-06 DIAGNOSIS — R87618 Other abnormal cytological findings on specimens from cervix uteri: Secondary | ICD-10-CM | POA: Diagnosis not present

## 2017-05-06 DIAGNOSIS — E663 Overweight: Secondary | ICD-10-CM | POA: Diagnosis not present

## 2017-05-19 DIAGNOSIS — Z713 Dietary counseling and surveillance: Secondary | ICD-10-CM | POA: Diagnosis not present

## 2017-05-19 DIAGNOSIS — Z6841 Body Mass Index (BMI) 40.0 and over, adult: Secondary | ICD-10-CM | POA: Diagnosis not present

## 2017-05-28 DIAGNOSIS — R21 Rash and other nonspecific skin eruption: Secondary | ICD-10-CM | POA: Diagnosis not present

## 2017-06-05 DIAGNOSIS — Z6841 Body Mass Index (BMI) 40.0 and over, adult: Secondary | ICD-10-CM | POA: Diagnosis not present

## 2017-06-05 DIAGNOSIS — F54 Psychological and behavioral factors associated with disorders or diseases classified elsewhere: Secondary | ICD-10-CM | POA: Diagnosis not present

## 2017-06-05 DIAGNOSIS — F419 Anxiety disorder, unspecified: Secondary | ICD-10-CM | POA: Diagnosis not present

## 2017-06-30 IMAGING — CR DG LUMBAR SPINE 2-3V
3 series · 3 of 3 positions shown · non-contrast
Comparison: None.

ADDENDUM:
Lumbar vertebral body levels numbered.
CLINICAL DATA: Lumbago

EXAM:
LUMBAR SPINE - 2-3 VIEW

[t l-spine a.p.]
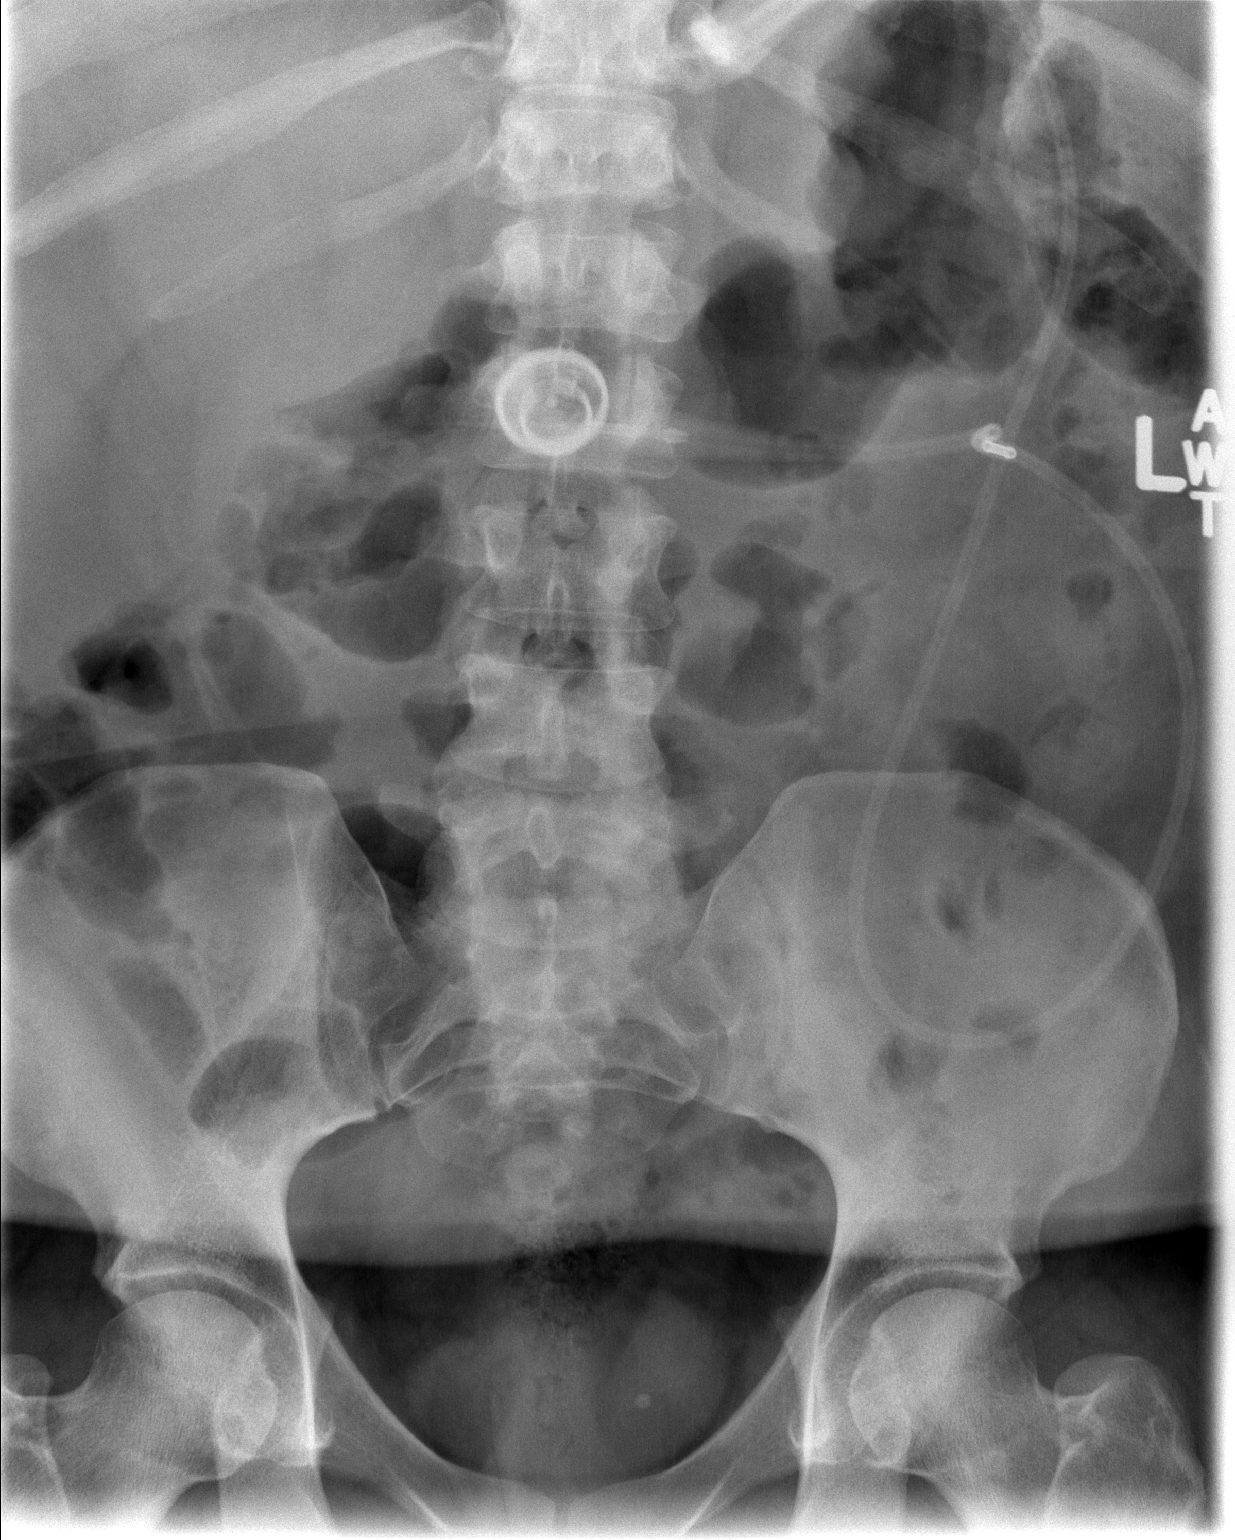

[t l-spine lat *]
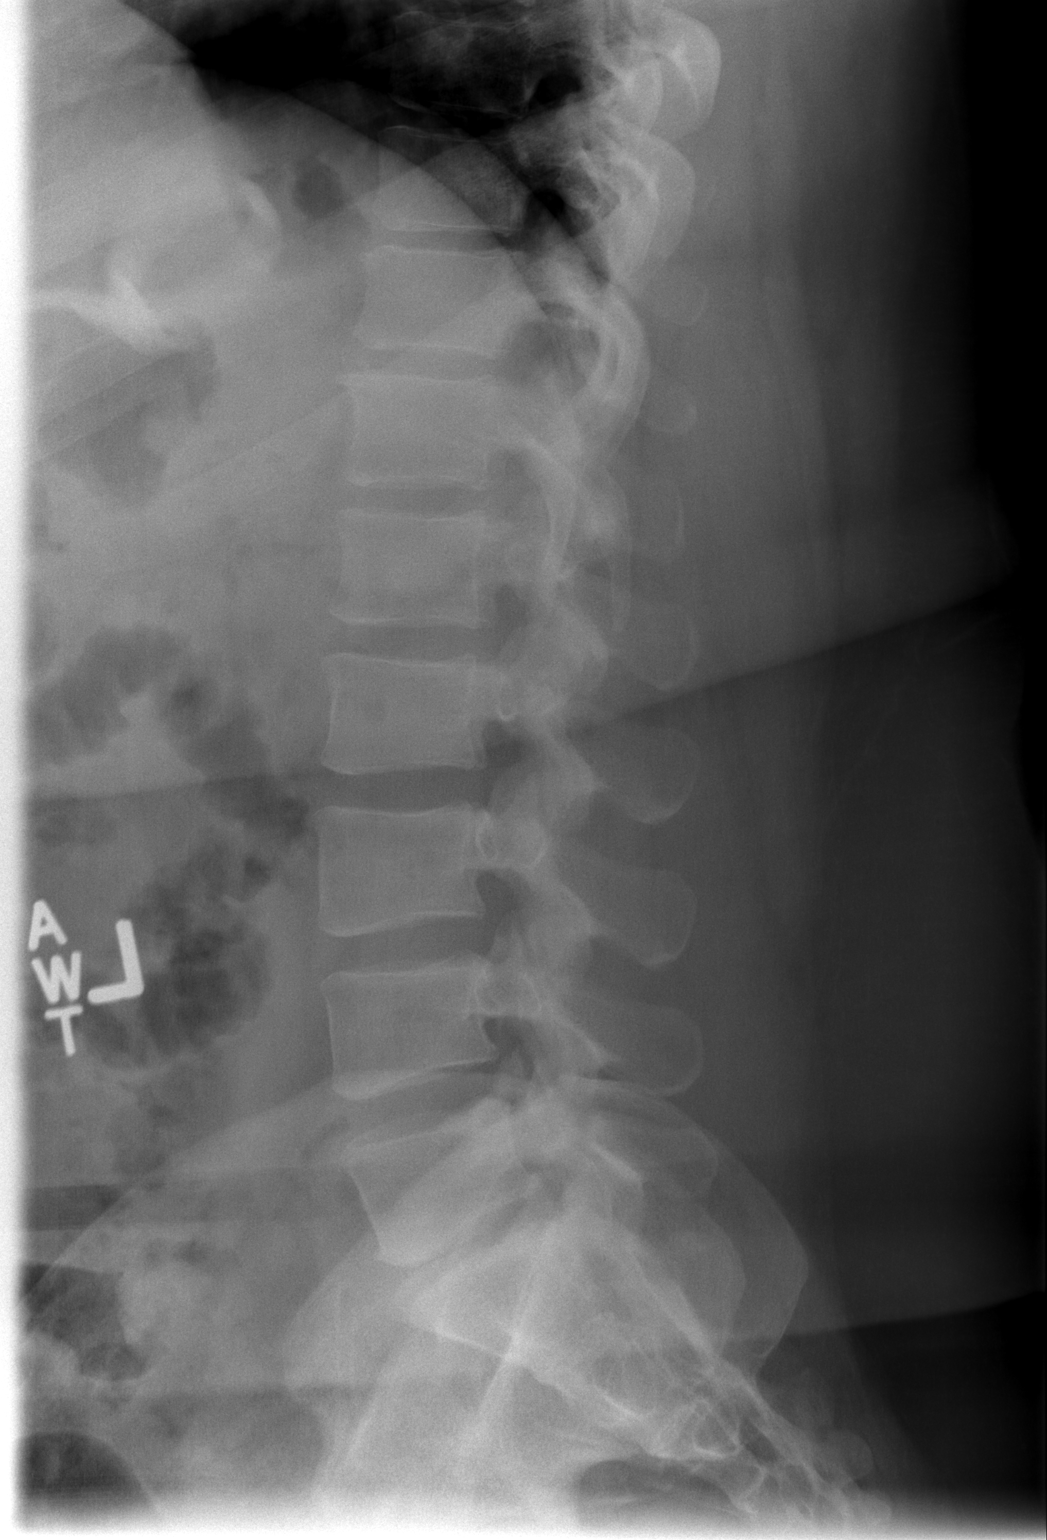

[t l-spine l5-s1 spot *]
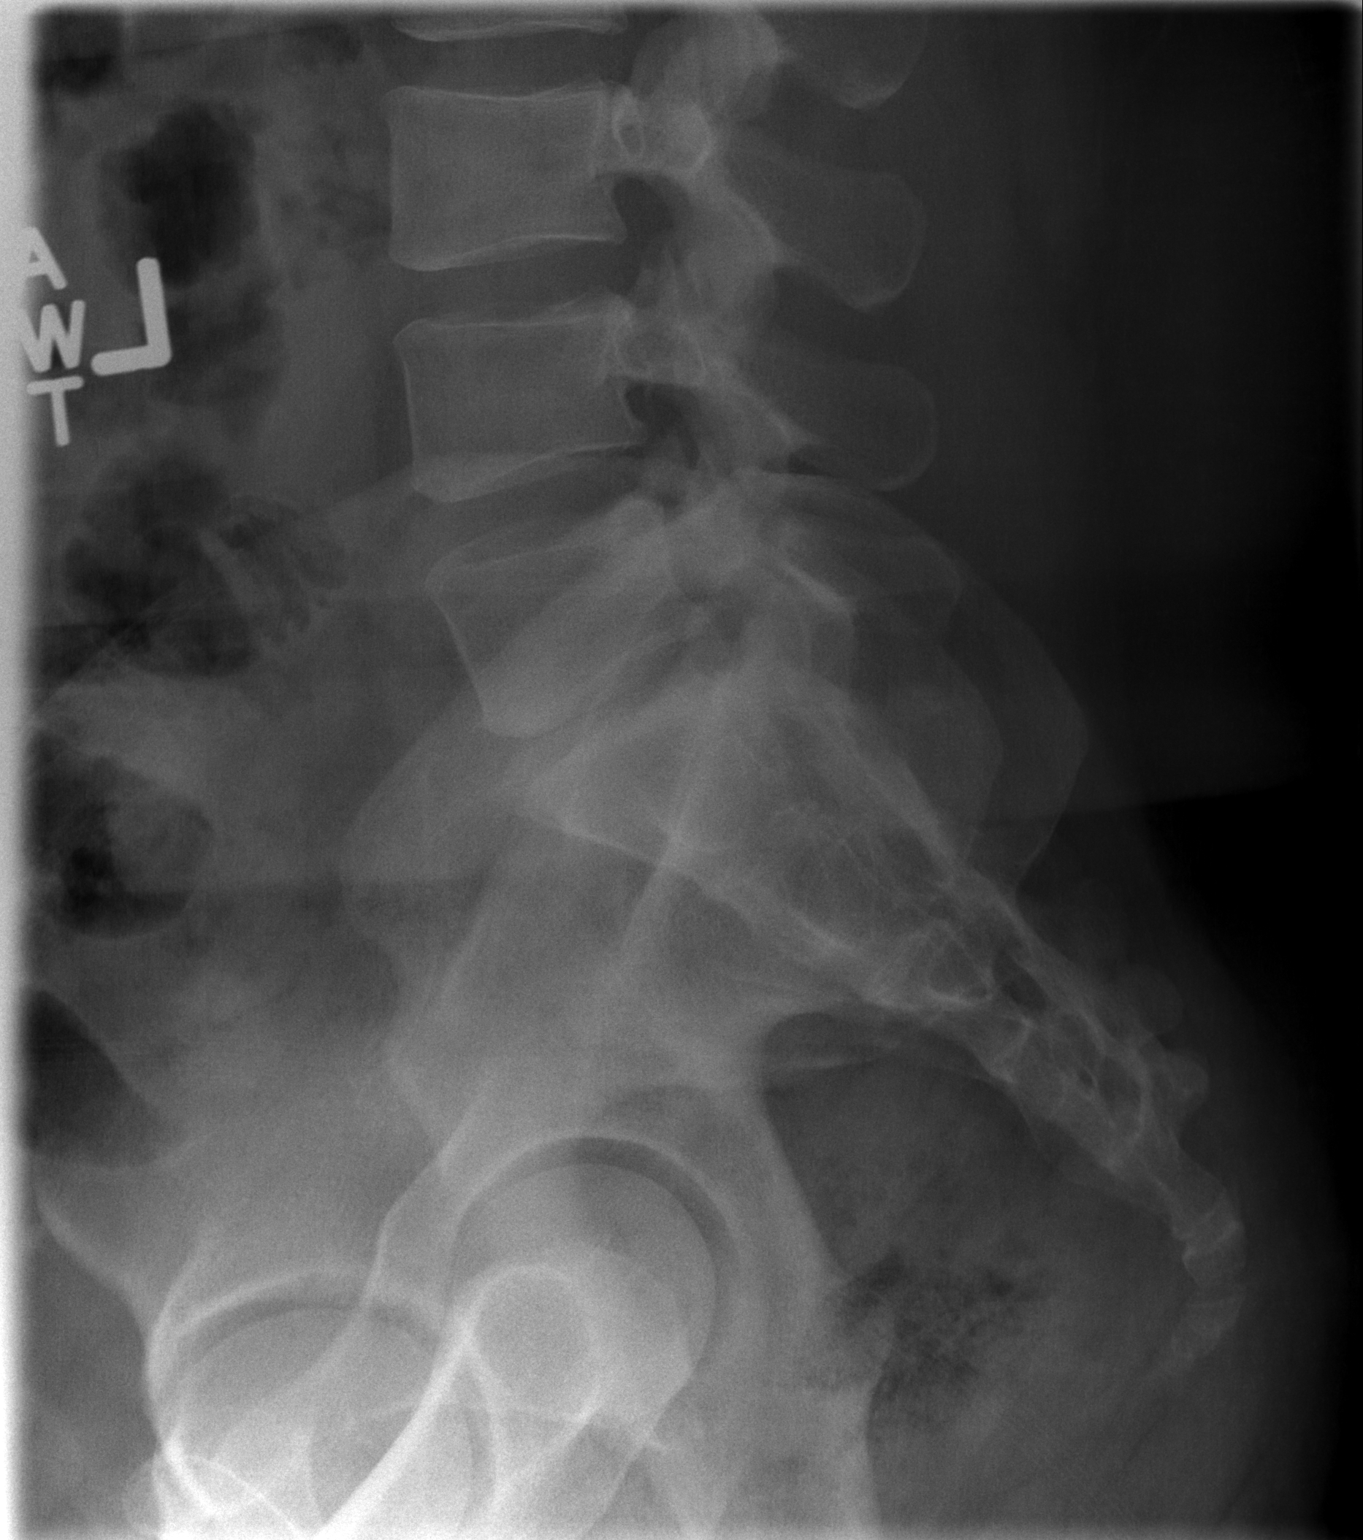

[3 of 3 positions shown; findings below may reference images not displayed]

FINDINGS: Frontal, lateral, and spot lumbosacral lateral images were obtained.
There are 5 non-rib-bearing lumbar type vertebral bodies. There is
no fracture or spondylolisthesis. There is moderate disc space
narrowing at L5-S1. Other disc spaces appear normal. No erosive
change. There is a lap band present.
IMPRESSION: Moderate disc space narrowing L5-S1. Other disc spaces appear
unremarkable. No fracture or spondylolisthesis.

## 2017-06-30 IMAGING — CR DG CHEST 2V
2 series · 2 of 2 positions shown · non-contrast
Comparison: None.

CLINICAL DATA: Preoperative evaluation for lumbar laminectomy

EXAM:
CHEST  2 VIEW

[w chest pa *]
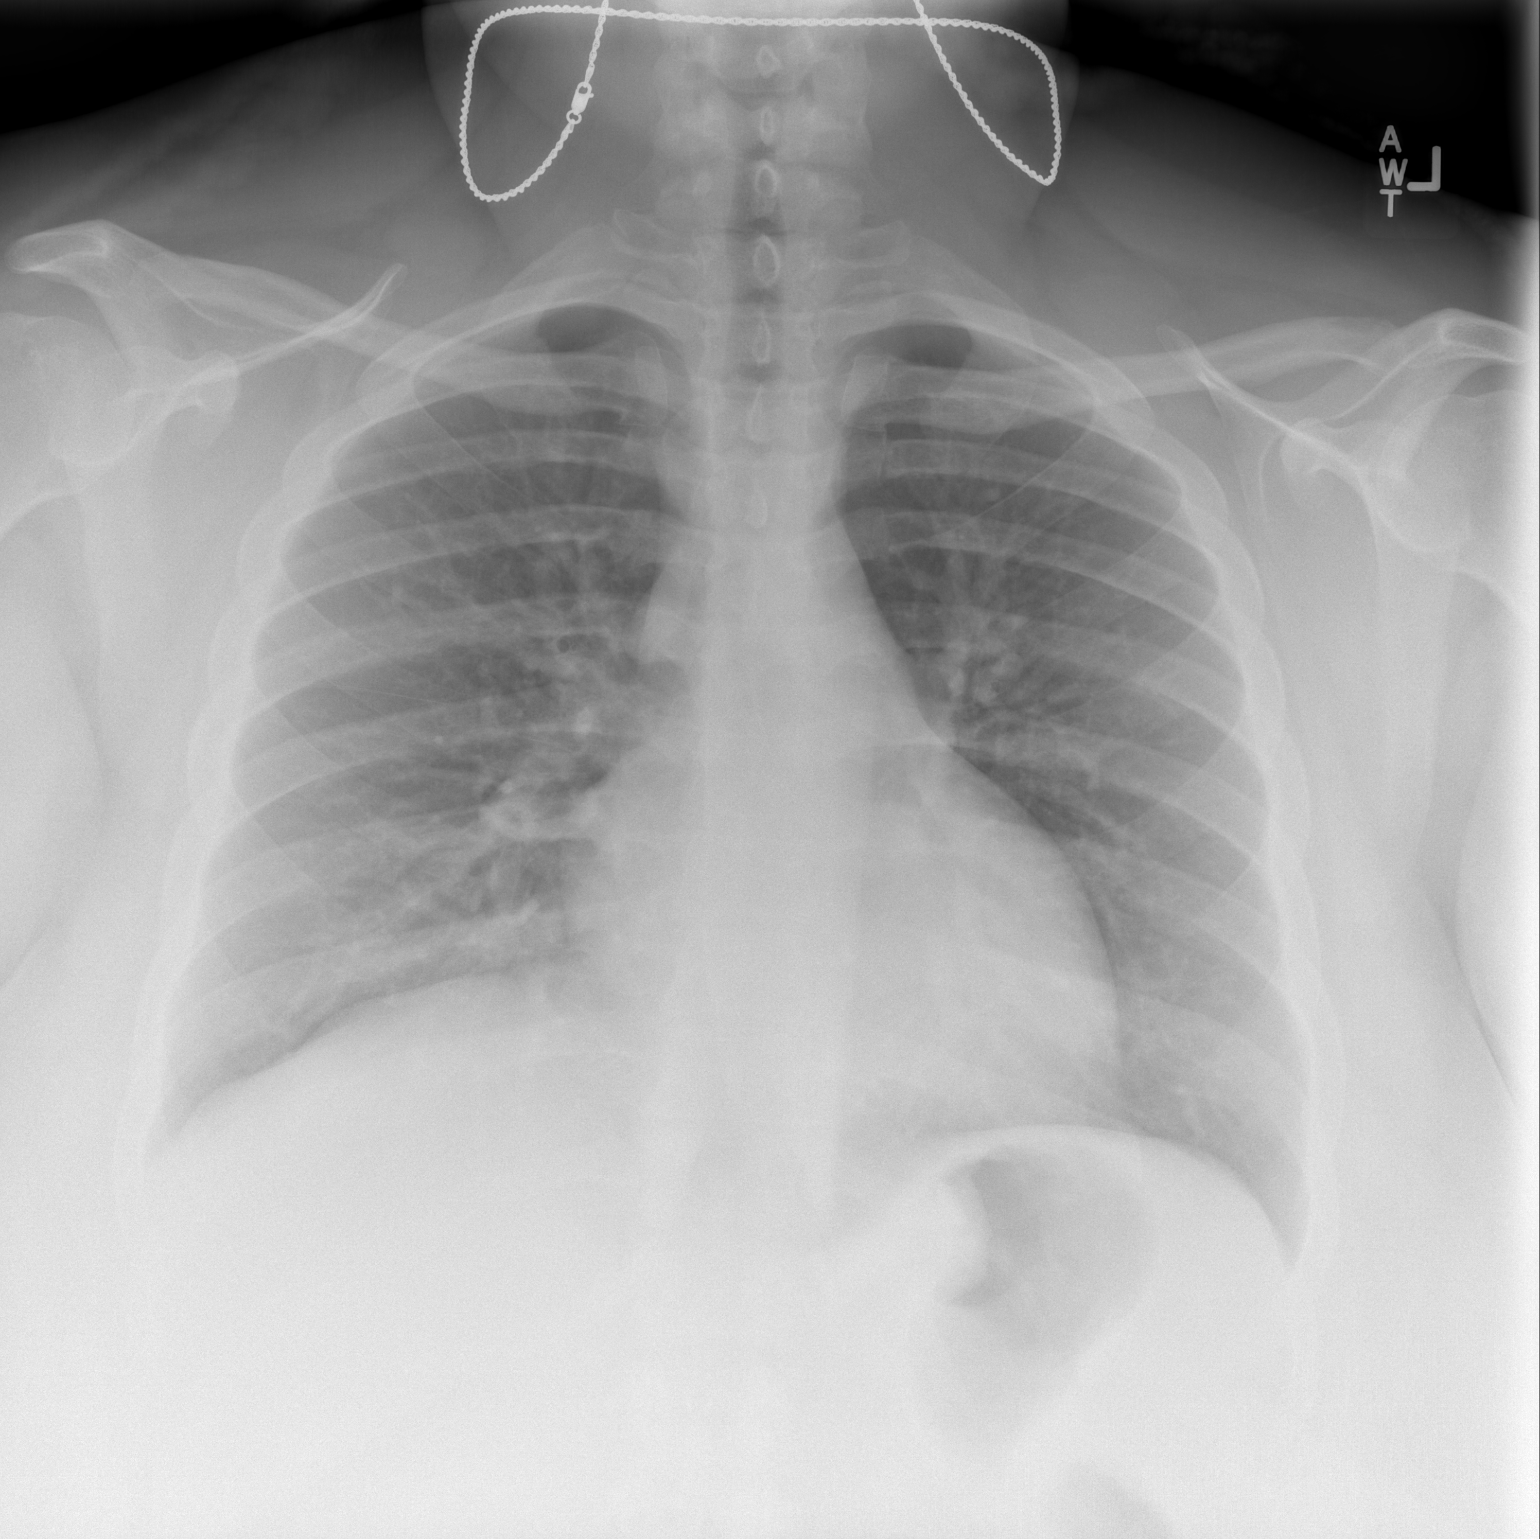

[w chest lat]
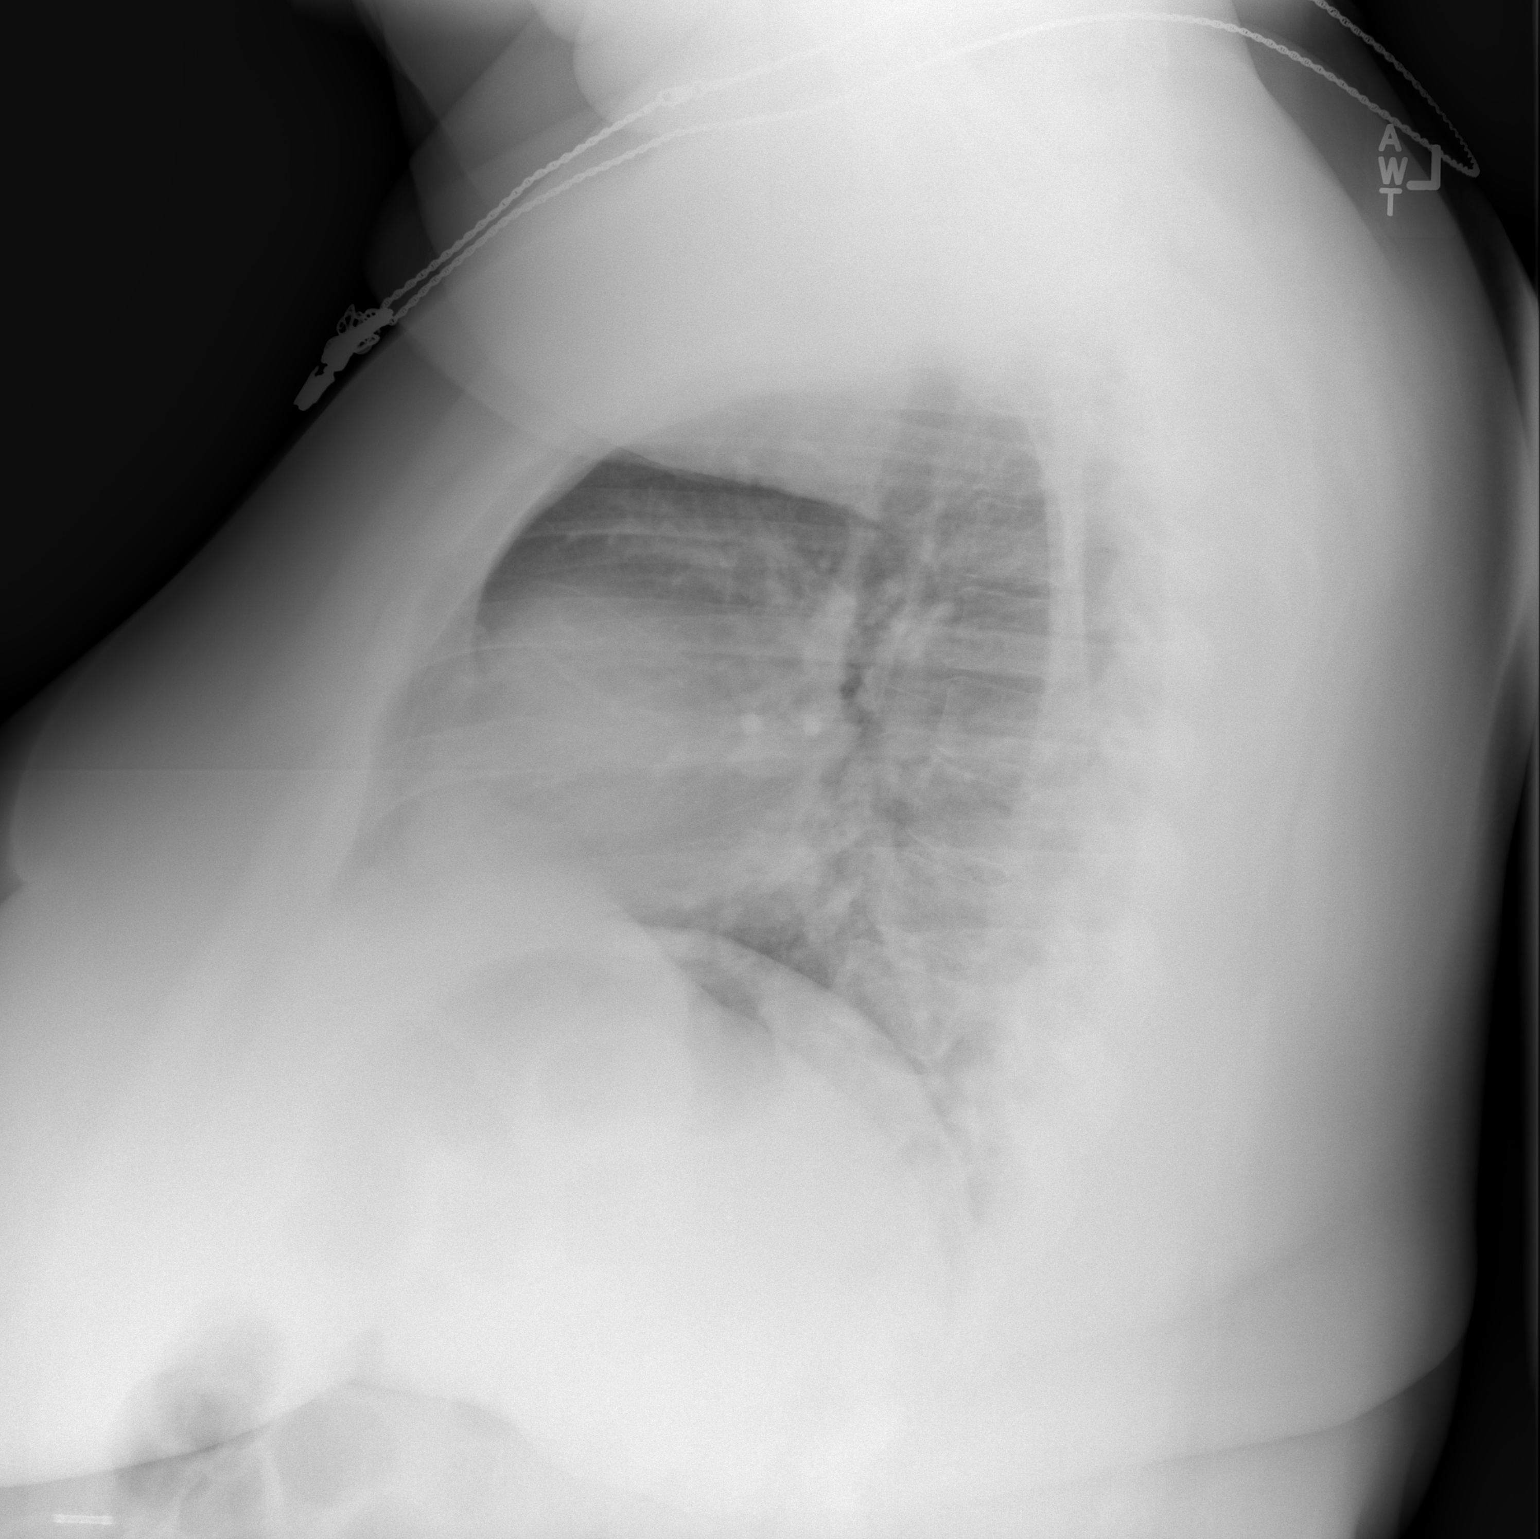

[2 of 2 positions shown; findings below may reference images not displayed]

FINDINGS: Lungs are clear. Heart size and pulmonary vascularity are normal. No
adenopathy. No bone lesions.
IMPRESSION: No edema or consolidation.

## 2017-07-07 DIAGNOSIS — Z6841 Body Mass Index (BMI) 40.0 and over, adult: Secondary | ICD-10-CM | POA: Diagnosis not present

## 2017-07-07 DIAGNOSIS — Z01818 Encounter for other preprocedural examination: Secondary | ICD-10-CM | POA: Diagnosis not present

## 2017-07-07 DIAGNOSIS — G4733 Obstructive sleep apnea (adult) (pediatric): Secondary | ICD-10-CM | POA: Diagnosis not present

## 2017-07-08 DIAGNOSIS — Z6841 Body Mass Index (BMI) 40.0 and over, adult: Secondary | ICD-10-CM | POA: Diagnosis not present

## 2017-07-08 DIAGNOSIS — Z01812 Encounter for preprocedural laboratory examination: Secondary | ICD-10-CM | POA: Diagnosis not present

## 2017-07-08 DIAGNOSIS — Z713 Dietary counseling and surveillance: Secondary | ICD-10-CM | POA: Diagnosis not present

## 2017-07-08 DIAGNOSIS — Z112 Encounter for screening for other bacterial diseases: Secondary | ICD-10-CM | POA: Diagnosis not present

## 2017-07-18 DIAGNOSIS — G4733 Obstructive sleep apnea (adult) (pediatric): Secondary | ICD-10-CM | POA: Diagnosis not present

## 2017-07-25 DIAGNOSIS — Z01818 Encounter for other preprocedural examination: Secondary | ICD-10-CM | POA: Diagnosis not present

## 2017-07-29 DIAGNOSIS — K910 Vomiting following gastrointestinal surgery: Secondary | ICD-10-CM | POA: Diagnosis not present

## 2017-07-29 DIAGNOSIS — K219 Gastro-esophageal reflux disease without esophagitis: Secondary | ICD-10-CM | POA: Diagnosis not present

## 2017-07-29 DIAGNOSIS — Y838 Other surgical procedures as the cause of abnormal reaction of the patient, or of later complication, without mention of misadventure at the time of the procedure: Secondary | ICD-10-CM | POA: Diagnosis not present

## 2017-07-29 DIAGNOSIS — Z6841 Body Mass Index (BMI) 40.0 and over, adult: Secondary | ICD-10-CM | POA: Diagnosis not present

## 2017-07-29 DIAGNOSIS — K9509 Other complications of gastric band procedure: Secondary | ICD-10-CM | POA: Diagnosis not present

## 2017-07-29 DIAGNOSIS — E119 Type 2 diabetes mellitus without complications: Secondary | ICD-10-CM | POA: Diagnosis not present

## 2017-07-29 DIAGNOSIS — Y733 Surgical instruments, materials and gastroenterology and urology devices (including sutures) associated with adverse incidents: Secondary | ICD-10-CM | POA: Diagnosis not present

## 2017-07-29 DIAGNOSIS — D509 Iron deficiency anemia, unspecified: Secondary | ICD-10-CM | POA: Diagnosis not present

## 2017-07-29 DIAGNOSIS — F329 Major depressive disorder, single episode, unspecified: Secondary | ICD-10-CM | POA: Diagnosis not present

## 2017-07-29 DIAGNOSIS — G4733 Obstructive sleep apnea (adult) (pediatric): Secondary | ICD-10-CM | POA: Diagnosis not present

## 2017-07-29 DIAGNOSIS — Z7984 Long term (current) use of oral hypoglycemic drugs: Secondary | ICD-10-CM | POA: Diagnosis not present

## 2017-07-30 DIAGNOSIS — D509 Iron deficiency anemia, unspecified: Secondary | ICD-10-CM | POA: Diagnosis not present

## 2017-07-30 DIAGNOSIS — K219 Gastro-esophageal reflux disease without esophagitis: Secondary | ICD-10-CM | POA: Diagnosis not present

## 2017-07-30 DIAGNOSIS — Z6841 Body Mass Index (BMI) 40.0 and over, adult: Secondary | ICD-10-CM | POA: Diagnosis not present

## 2017-07-30 DIAGNOSIS — F329 Major depressive disorder, single episode, unspecified: Secondary | ICD-10-CM | POA: Diagnosis not present

## 2017-07-30 DIAGNOSIS — E119 Type 2 diabetes mellitus without complications: Secondary | ICD-10-CM | POA: Diagnosis not present

## 2017-07-30 DIAGNOSIS — G4733 Obstructive sleep apnea (adult) (pediatric): Secondary | ICD-10-CM | POA: Diagnosis not present

## 2017-07-30 DIAGNOSIS — Z7984 Long term (current) use of oral hypoglycemic drugs: Secondary | ICD-10-CM | POA: Diagnosis not present

## 2017-07-30 DIAGNOSIS — K9509 Other complications of gastric band procedure: Secondary | ICD-10-CM | POA: Diagnosis not present

## 2017-07-30 DIAGNOSIS — K910 Vomiting following gastrointestinal surgery: Secondary | ICD-10-CM | POA: Diagnosis not present

## 2017-07-30 DIAGNOSIS — Y733 Surgical instruments, materials and gastroenterology and urology devices (including sutures) associated with adverse incidents: Secondary | ICD-10-CM | POA: Diagnosis not present

## 2017-07-30 DIAGNOSIS — Y838 Other surgical procedures as the cause of abnormal reaction of the patient, or of later complication, without mention of misadventure at the time of the procedure: Secondary | ICD-10-CM | POA: Diagnosis not present

## 2017-08-21 DIAGNOSIS — Z713 Dietary counseling and surveillance: Secondary | ICD-10-CM | POA: Diagnosis not present

## 2017-08-21 DIAGNOSIS — Z903 Acquired absence of stomach [part of]: Secondary | ICD-10-CM | POA: Diagnosis not present

## 2017-08-21 DIAGNOSIS — Z6841 Body Mass Index (BMI) 40.0 and over, adult: Secondary | ICD-10-CM | POA: Diagnosis not present

## 2017-09-25 DIAGNOSIS — Z713 Dietary counseling and surveillance: Secondary | ICD-10-CM | POA: Diagnosis not present

## 2017-09-25 DIAGNOSIS — Z6841 Body Mass Index (BMI) 40.0 and over, adult: Secondary | ICD-10-CM | POA: Diagnosis not present

## 2017-09-25 DIAGNOSIS — Z903 Acquired absence of stomach [part of]: Secondary | ICD-10-CM | POA: Diagnosis not present

## 2017-10-06 DIAGNOSIS — F331 Major depressive disorder, recurrent, moderate: Secondary | ICD-10-CM | POA: Diagnosis not present

## 2017-10-06 DIAGNOSIS — F900 Attention-deficit hyperactivity disorder, predominantly inattentive type: Secondary | ICD-10-CM | POA: Diagnosis not present

## 2017-11-24 DIAGNOSIS — F4320 Adjustment disorder, unspecified: Secondary | ICD-10-CM | POA: Diagnosis not present

## 2017-11-24 DIAGNOSIS — E569 Vitamin deficiency, unspecified: Secondary | ICD-10-CM | POA: Diagnosis not present

## 2017-12-04 DIAGNOSIS — Z6841 Body Mass Index (BMI) 40.0 and over, adult: Secondary | ICD-10-CM | POA: Diagnosis not present

## 2017-12-04 DIAGNOSIS — R7303 Prediabetes: Secondary | ICD-10-CM | POA: Diagnosis not present

## 2017-12-04 DIAGNOSIS — E611 Iron deficiency: Secondary | ICD-10-CM | POA: Diagnosis not present

## 2017-12-04 DIAGNOSIS — R635 Abnormal weight gain: Secondary | ICD-10-CM | POA: Diagnosis not present

## 2017-12-25 DIAGNOSIS — F410 Panic disorder [episodic paroxysmal anxiety] without agoraphobia: Secondary | ICD-10-CM | POA: Diagnosis not present

## 2017-12-25 DIAGNOSIS — F900 Attention-deficit hyperactivity disorder, predominantly inattentive type: Secondary | ICD-10-CM | POA: Diagnosis not present

## 2017-12-25 DIAGNOSIS — F3342 Major depressive disorder, recurrent, in full remission: Secondary | ICD-10-CM | POA: Diagnosis not present

## 2018-01-28 DIAGNOSIS — Z0000 Encounter for general adult medical examination without abnormal findings: Secondary | ICD-10-CM | POA: Diagnosis not present

## 2018-01-28 DIAGNOSIS — R7309 Other abnormal glucose: Secondary | ICD-10-CM | POA: Diagnosis not present

## 2018-01-28 DIAGNOSIS — E559 Vitamin D deficiency, unspecified: Secondary | ICD-10-CM | POA: Diagnosis not present

## 2018-01-28 DIAGNOSIS — Z6841 Body Mass Index (BMI) 40.0 and over, adult: Secondary | ICD-10-CM | POA: Diagnosis not present

## 2018-01-29 DIAGNOSIS — Z6841 Body Mass Index (BMI) 40.0 and over, adult: Secondary | ICD-10-CM | POA: Diagnosis not present

## 2018-01-29 DIAGNOSIS — Z903 Acquired absence of stomach [part of]: Secondary | ICD-10-CM | POA: Diagnosis not present

## 2018-02-02 DIAGNOSIS — J3089 Other allergic rhinitis: Secondary | ICD-10-CM | POA: Diagnosis not present

## 2018-02-02 DIAGNOSIS — J301 Allergic rhinitis due to pollen: Secondary | ICD-10-CM | POA: Diagnosis not present

## 2018-02-19 DIAGNOSIS — Z903 Acquired absence of stomach [part of]: Secondary | ICD-10-CM | POA: Diagnosis not present

## 2018-02-19 DIAGNOSIS — Z6841 Body Mass Index (BMI) 40.0 and over, adult: Secondary | ICD-10-CM | POA: Diagnosis not present

## 2018-02-19 DIAGNOSIS — Z713 Dietary counseling and surveillance: Secondary | ICD-10-CM | POA: Diagnosis not present

## 2018-03-16 DIAGNOSIS — R079 Chest pain, unspecified: Secondary | ICD-10-CM | POA: Diagnosis not present

## 2018-03-16 DIAGNOSIS — Z8249 Family history of ischemic heart disease and other diseases of the circulatory system: Secondary | ICD-10-CM | POA: Diagnosis not present

## 2018-03-16 DIAGNOSIS — Z6841 Body Mass Index (BMI) 40.0 and over, adult: Secondary | ICD-10-CM | POA: Diagnosis not present

## 2018-04-15 ENCOUNTER — Ambulatory Visit: Payer: BLUE CROSS/BLUE SHIELD | Admitting: Cardiovascular Disease

## 2018-04-15 ENCOUNTER — Encounter: Payer: Self-pay | Admitting: Cardiovascular Disease

## 2018-04-15 VITALS — BP 126/70 | HR 87 | Ht 69.0 in | Wt 290.0 lb

## 2018-04-15 DIAGNOSIS — R002 Palpitations: Secondary | ICD-10-CM | POA: Diagnosis not present

## 2018-04-15 DIAGNOSIS — R0789 Other chest pain: Secondary | ICD-10-CM | POA: Diagnosis not present

## 2018-04-15 NOTE — Progress Notes (Signed)
Cardiology Office Note   Date:  04/15/2018   ID:  [name], DOB 1980-11-22, MRN [id number]  PCP:  Dorothyann Peng, MD  Cardiologist:   Chilton Si, MD   No chief complaint on file.    History of Present Illness: [name] is a 37 y.o. female with depression, bipolar disorder, and morbid obesity status post gastric sleeve who is being seen today for the evaluation of chest pain at the request of Arnette Felts, FNP.  [name] saw Franchot Mimes, NP on 03/23/2018.  At that time she reported episodes of chest pain and left arm discomfort.  In August 2019 she had an episode of heart fluttering.  It occurred when lying down in bed.  The episode lasted for approximately 3 seconds.  There is no associated lightheadedness, dizziness, chest pain, or shortness of breath.  She has not had any recurrent episodes of that since that time.  She had not had any caffeine or alcohol.  She did not have any acid reflux at the time.  She was feeling stressed because her mom had surgery and was planning to retire soon.  The following week she had an episode of chest discomfort.  This occurred while sitting in the car.  There is no associated shortness of breath, nausea, or diaphoresis.  The episode lasted for a minute she also had some radiation into her left and right arms.  She exercises regularly on the treadmill, on the elliptical and using the Wii fit.  She has no exertional chest pain or shortness of breath with this activity.  She has not had any lower extremity edema, orthopnea, or PND.  In general she has felt well.    Past Medical History:  Diagnosis Date  . Anxiety   . Carpal tunnel syndrome on left   . Depression   . Diabetes mellitus without complication ([facility])    type 2 on metformin since april  . Lumbar spinal stenosis   . Sleep apnea    no cpap used    Past Surgical History:  Procedure Laterality Date  . CARPAL TUNNEL RELEASE Left 07/14/2014   Procedure: LEFT CARPAL TUNNEL  RELEASE;  Surgeon: Sharma Covert, MD;  Location: [facility];  Service: Orthopedics;  Laterality: Left;  . LAPAROSCOPIC GASTRIC BANDING  2011  . LUMBAR LAMINECTOMY/DECOMPRESSION MICRODISCECTOMY N/A 01/29/2017   Procedure: Central decompression, lumbar laminectomy L5-S1 with microdisectomy;  Surgeon: Ranee Gosselin, MD;  Location: [facility];  Service: Orthopedics;  Laterality: N/A;  . WISDOM TOOTH EXTRACTION       Current Outpatient Medications  Medication Sig Dispense Refill  . ALPRAZolam (XANAX) 0.5 MG tablet Take 0.5 mg by mouth 3 (three) times daily as needed for anxiety.     Marland Kitchen amphetamine-dextroamphetamine (ADDERALL) 30 MG tablet Take 30 mg by mouth 3 (three) times daily.    . folic acid (FOLVITE) 800 MCG tablet Take 400 mcg by mouth daily.    . Lactobacillus Rhamnosus, GG, (CULTURELLE PROBIOTICS KIDS PO) Take by mouth 2 (two) times daily.    Marland Kitchen levocetirizine (XYZAL) 5 MG tablet Take 5 mg by mouth every evening.    . montelukast (SINGULAIR) 10 MG tablet Take 10 mg by mouth at bedtime.    . Multiple Vitamins-Minerals (MULTIVITAMIN WITH MINERALS) tablet Take 1 tablet by mouth daily.    . traZODone (DESYREL) 50 MG tablet Take 100 mg by mouth 3 (three) times daily.     Marland Kitchen vortioxetine HBr (TRINTELLIX) 20 MG  TABS tablet Take 20 mg by mouth daily.     No current facility-administered medications for this visit.     Allergies:   Patient has no known allergies.    Social History:  The patient  reports that she has never smoked. She has never used smokeless tobacco. She reports that she drinks alcohol. She reports that she does not use drugs.   Family History:  The patient's family history includes CAD in her father; Cervical cancer in her maternal grandmother; Diabetes in her father, maternal grandfather, maternal grandmother, mother, paternal grandfather, and paternal grandmother; Heart attack in her maternal grandfather; Heart failure in her father; Hyperlipidemia in her  maternal grandfather, maternal grandmother, mother, and paternal grandfather; Hypertension in her father, maternal grandfather, maternal grandmother, mother, paternal grandfather, and paternal grandmother; Kidney disease in her maternal grandfather; Peripheral Artery Disease in her father and paternal grandfather.    ROS:  Please see the history of present illness.   Otherwise, review of systems are positive for none.   All other systems are reviewed and negative.    PHYSICAL EXAM: VS:  BP 126/70   Pulse 87   Ht 5\' 9"  (1.753 m)   Wt 290 lb (131.5 kg)   BMI 42.83 kg/m  , BMI Body mass index is 42.83 kg/m. GENERAL:  Well appearing HEENT:  Pupils equal round and reactive, fundi not visualized, oral mucosa unremarkable NECK:  No jugular venous distention, waveform within normal limits, carotid upstroke brisk and symmetric, no bruits, no thyromegaly LYMPHATICS:  No cervical adenopathy LUNGS:  Clear to auscultation bilaterally HEART:  RRR.  PMI not displaced or sustained,S1 and S2 within normal limits, no S3, no S4, no clicks, no rubs, no murmurs ABD:  Flat, positive bowel sounds normal in frequency in pitch, no bruits, no rebound, no guarding, no midline pulsatile mass, no hepatomegaly, no splenomegaly EXT:  2 plus pulses throughout, no edema, no cyanosis no clubbing SKIN:  No rashes no nodules NEURO:  Cranial nerves II through XII grossly intact, motor grossly intact throughout PSYCH:  Cognitively intact, oriented to person place and time   EKG:  EKG is ordered today. The ekg ordered today demonstrates sinus rhythm.  Rate 87 bpm.     Recent Labs: No results found for requested labs within last 8760 hours.    Lipid Panel No results found for: CHOL, TRIG, HDL, CHOLHDL, VLDL, LDLCALC, LDLDIRECT    Wt Readings from Last 3 Encounters:  04/15/18 290 lb (131.5 kg)  01/29/17 (!) 349 lb (158.3 kg)  01/23/17 (!) 349 lb (158.3 kg)      ASSESSMENT AND PLAN:  # Atypical chest pain:   [name] had one episode of very atypical chest pain that occurred while at rest.  She exercises regularly and has not had any recurrent symptoms.  Given the atypical nature and her age, we will not pursue any ischemic evaluation at this time.  If she has exertional symptoms we will plan for further testing.  # Palpitations: [name] had one episode of palpitations that lasted for a few seconds.  They have not recurred in over a month.  Therefore, ambulatory monitoring is of limited utility.  Her labs are very reassuring.  # CV Disease Prevention: We discussed increasing her exercise to 150 minutes per week.  She is getting 2000 steps on her pedometer.  Ultimately her goal should be 10,000 steps daily.  For now she will try to increase this to 5000.  She was congratulated on  her weight loss.  Lipids were checked with her PCP and were well-controlled.    Current medicines are reviewed at length with the patient today.  The patient does not have concerns regarding medicines.  The following changes have been made:  no change  Labs/ tests ordered today include:  No orders of the defined types were placed in this encounter.    Disposition:   FU with Latishia Suitt C. Duke Salvia, MD, [facility] as needed.     Signed, Jinx Gilden C. Duke Salvia, MD, [facility]  04/15/2018 1:18 PM    [facility]

## 2018-04-15 NOTE — Patient Instructions (Signed)
Medication Instructions:  ?Your physician recommends that you continue on your current medications as directed. Please refer to the Current Medication list given to you today.  ? ?Labwork: ?NONE ? ?Testing/Procedures: ?NONE ? ?Follow-Up: ?AS NEEDED  ? ?  ?

## 2018-05-07 DIAGNOSIS — Z01419 Encounter for gynecological examination (general) (routine) without abnormal findings: Secondary | ICD-10-CM | POA: Diagnosis not present

## 2018-05-07 DIAGNOSIS — Z6841 Body Mass Index (BMI) 40.0 and over, adult: Secondary | ICD-10-CM | POA: Diagnosis not present

## 2018-05-12 ENCOUNTER — Ambulatory Visit (INDEPENDENT_AMBULATORY_CARE_PROVIDER_SITE_OTHER): Payer: BLUE CROSS/BLUE SHIELD | Admitting: Internal Medicine

## 2018-05-12 DIAGNOSIS — Z23 Encounter for immunization: Secondary | ICD-10-CM | POA: Diagnosis not present

## 2018-05-13 DIAGNOSIS — Z23 Encounter for immunization: Secondary | ICD-10-CM | POA: Insufficient documentation

## 2018-05-13 NOTE — Progress Notes (Signed)
The patient came for a flu shot.

## 2018-05-13 NOTE — Patient Instructions (Signed)
The patient came in for a flu shot.

## 2018-05-23 DIAGNOSIS — M25571 Pain in right ankle and joints of right foot: Secondary | ICD-10-CM | POA: Diagnosis not present

## 2018-05-23 DIAGNOSIS — M79671 Pain in right foot: Secondary | ICD-10-CM | POA: Diagnosis not present

## 2018-06-05 DIAGNOSIS — Z6841 Body Mass Index (BMI) 40.0 and over, adult: Secondary | ICD-10-CM | POA: Diagnosis not present

## 2018-06-06 DIAGNOSIS — M79671 Pain in right foot: Secondary | ICD-10-CM | POA: Diagnosis not present

## 2018-06-18 DIAGNOSIS — F4010 Social phobia, unspecified: Secondary | ICD-10-CM | POA: Diagnosis not present

## 2018-06-18 DIAGNOSIS — F410 Panic disorder [episodic paroxysmal anxiety] without agoraphobia: Secondary | ICD-10-CM | POA: Diagnosis not present

## 2018-06-18 DIAGNOSIS — F900 Attention-deficit hyperactivity disorder, predominantly inattentive type: Secondary | ICD-10-CM | POA: Diagnosis not present

## 2018-07-29 ENCOUNTER — Ambulatory Visit: Payer: BLUE CROSS/BLUE SHIELD | Admitting: Internal Medicine

## 2018-07-30 ENCOUNTER — Ambulatory Visit: Payer: BLUE CROSS/BLUE SHIELD | Admitting: Internal Medicine

## 2019-05-13 DIAGNOSIS — N920 Excessive and frequent menstruation with regular cycle: Secondary | ICD-10-CM | POA: Diagnosis not present

## 2019-05-13 DIAGNOSIS — Z6841 Body Mass Index (BMI) 40.0 and over, adult: Secondary | ICD-10-CM | POA: Diagnosis not present

## 2019-05-13 DIAGNOSIS — Z01419 Encounter for gynecological examination (general) (routine) without abnormal findings: Secondary | ICD-10-CM | POA: Diagnosis not present

## 2019-05-13 DIAGNOSIS — N946 Dysmenorrhea, unspecified: Secondary | ICD-10-CM | POA: Diagnosis not present

## 2019-05-18 DIAGNOSIS — E559 Vitamin D deficiency, unspecified: Secondary | ICD-10-CM | POA: Diagnosis not present

## 2019-05-18 DIAGNOSIS — R030 Elevated blood-pressure reading, without diagnosis of hypertension: Secondary | ICD-10-CM | POA: Diagnosis not present

## 2019-05-18 DIAGNOSIS — R635 Abnormal weight gain: Secondary | ICD-10-CM | POA: Diagnosis not present

## 2019-06-07 DIAGNOSIS — F4010 Social phobia, unspecified: Secondary | ICD-10-CM | POA: Diagnosis not present

## 2019-06-07 DIAGNOSIS — F3342 Major depressive disorder, recurrent, in full remission: Secondary | ICD-10-CM | POA: Diagnosis not present

## 2019-06-07 DIAGNOSIS — F900 Attention-deficit hyperactivity disorder, predominantly inattentive type: Secondary | ICD-10-CM | POA: Diagnosis not present

## 2019-06-17 ENCOUNTER — Encounter: Payer: Self-pay | Admitting: Internal Medicine

## 2019-06-29 DIAGNOSIS — R635 Abnormal weight gain: Secondary | ICD-10-CM | POA: Diagnosis not present

## 2019-06-29 DIAGNOSIS — Z6841 Body Mass Index (BMI) 40.0 and over, adult: Secondary | ICD-10-CM | POA: Diagnosis not present

## 2019-06-29 DIAGNOSIS — E611 Iron deficiency: Secondary | ICD-10-CM | POA: Diagnosis not present

## 2019-06-30 ENCOUNTER — Other Ambulatory Visit: Payer: Self-pay | Admitting: Obstetrics and Gynecology

## 2019-06-30 DIAGNOSIS — N920 Excessive and frequent menstruation with regular cycle: Secondary | ICD-10-CM | POA: Diagnosis not present

## 2019-06-30 DIAGNOSIS — N840 Polyp of corpus uteri: Secondary | ICD-10-CM | POA: Diagnosis not present

## 2019-06-30 DIAGNOSIS — N946 Dysmenorrhea, unspecified: Secondary | ICD-10-CM | POA: Diagnosis not present

## 2019-06-30 DIAGNOSIS — R102 Pelvic and perineal pain: Secondary | ICD-10-CM | POA: Diagnosis not present

## 2019-07-30 DIAGNOSIS — L299 Pruritus, unspecified: Secondary | ICD-10-CM | POA: Diagnosis not present

## 2019-07-30 DIAGNOSIS — J3089 Other allergic rhinitis: Secondary | ICD-10-CM | POA: Diagnosis not present

## 2019-07-30 DIAGNOSIS — R05 Cough: Secondary | ICD-10-CM | POA: Diagnosis not present

## 2019-07-30 DIAGNOSIS — J301 Allergic rhinitis due to pollen: Secondary | ICD-10-CM | POA: Diagnosis not present

## 2019-08-23 DIAGNOSIS — H40013 Open angle with borderline findings, low risk, bilateral: Secondary | ICD-10-CM | POA: Diagnosis not present

## 2019-12-03 DIAGNOSIS — F3342 Major depressive disorder, recurrent, in full remission: Secondary | ICD-10-CM | POA: Diagnosis not present

## 2019-12-03 DIAGNOSIS — F900 Attention-deficit hyperactivity disorder, predominantly inattentive type: Secondary | ICD-10-CM | POA: Diagnosis not present

## 2019-12-03 DIAGNOSIS — F410 Panic disorder [episodic paroxysmal anxiety] without agoraphobia: Secondary | ICD-10-CM | POA: Diagnosis not present

## 2020-02-21 DIAGNOSIS — H40013 Open angle with borderline findings, low risk, bilateral: Secondary | ICD-10-CM | POA: Diagnosis not present

## 2020-03-16 DIAGNOSIS — Z20828 Contact with and (suspected) exposure to other viral communicable diseases: Secondary | ICD-10-CM | POA: Diagnosis not present

## 2020-04-27 DIAGNOSIS — Z20828 Contact with and (suspected) exposure to other viral communicable diseases: Secondary | ICD-10-CM | POA: Diagnosis not present

## 2020-05-03 DIAGNOSIS — J301 Allergic rhinitis due to pollen: Secondary | ICD-10-CM | POA: Diagnosis not present

## 2020-05-03 DIAGNOSIS — L299 Pruritus, unspecified: Secondary | ICD-10-CM | POA: Diagnosis not present

## 2020-05-03 DIAGNOSIS — J3089 Other allergic rhinitis: Secondary | ICD-10-CM | POA: Diagnosis not present

## 2020-05-03 DIAGNOSIS — J019 Acute sinusitis, unspecified: Secondary | ICD-10-CM | POA: Diagnosis not present

## 2020-05-26 DIAGNOSIS — F3342 Major depressive disorder, recurrent, in full remission: Secondary | ICD-10-CM | POA: Diagnosis not present

## 2020-05-26 DIAGNOSIS — F410 Panic disorder [episodic paroxysmal anxiety] without agoraphobia: Secondary | ICD-10-CM | POA: Diagnosis not present

## 2020-05-26 DIAGNOSIS — F900 Attention-deficit hyperactivity disorder, predominantly inattentive type: Secondary | ICD-10-CM | POA: Diagnosis not present

## 2020-06-09 DIAGNOSIS — Z01419 Encounter for gynecological examination (general) (routine) without abnormal findings: Secondary | ICD-10-CM | POA: Diagnosis not present

## 2020-06-09 DIAGNOSIS — Z124 Encounter for screening for malignant neoplasm of cervix: Secondary | ICD-10-CM | POA: Diagnosis not present

## 2020-06-09 DIAGNOSIS — Z3040 Encounter for surveillance of contraceptives, unspecified: Secondary | ICD-10-CM | POA: Diagnosis not present

## 2020-11-21 DIAGNOSIS — F900 Attention-deficit hyperactivity disorder, predominantly inattentive type: Secondary | ICD-10-CM | POA: Diagnosis not present

## 2020-11-21 DIAGNOSIS — F4010 Social phobia, unspecified: Secondary | ICD-10-CM | POA: Diagnosis not present

## 2020-11-21 DIAGNOSIS — F3342 Major depressive disorder, recurrent, in full remission: Secondary | ICD-10-CM | POA: Diagnosis not present

## 2021-01-12 DIAGNOSIS — N912 Amenorrhea, unspecified: Secondary | ICD-10-CM | POA: Diagnosis not present

## 2021-01-31 DIAGNOSIS — H40013 Open angle with borderline findings, low risk, bilateral: Secondary | ICD-10-CM | POA: Diagnosis not present

## 2021-03-05 DIAGNOSIS — F3342 Major depressive disorder, recurrent, in full remission: Secondary | ICD-10-CM | POA: Diagnosis not present

## 2021-03-05 DIAGNOSIS — F900 Attention-deficit hyperactivity disorder, predominantly inattentive type: Secondary | ICD-10-CM | POA: Diagnosis not present

## 2021-03-05 DIAGNOSIS — F410 Panic disorder [episodic paroxysmal anxiety] without agoraphobia: Secondary | ICD-10-CM | POA: Diagnosis not present

## 2021-03-05 DIAGNOSIS — F4322 Adjustment disorder with anxiety: Secondary | ICD-10-CM | POA: Diagnosis not present

## 2021-03-14 DIAGNOSIS — R7309 Other abnormal glucose: Secondary | ICD-10-CM | POA: Diagnosis not present

## 2021-03-14 DIAGNOSIS — Z903 Acquired absence of stomach [part of]: Secondary | ICD-10-CM | POA: Diagnosis not present

## 2021-03-14 DIAGNOSIS — Z79899 Other long term (current) drug therapy: Secondary | ICD-10-CM | POA: Diagnosis not present

## 2021-03-14 DIAGNOSIS — F3341 Major depressive disorder, recurrent, in partial remission: Secondary | ICD-10-CM | POA: Diagnosis not present

## 2021-03-14 DIAGNOSIS — J301 Allergic rhinitis due to pollen: Secondary | ICD-10-CM | POA: Diagnosis not present

## 2021-05-14 DIAGNOSIS — F900 Attention-deficit hyperactivity disorder, predominantly inattentive type: Secondary | ICD-10-CM | POA: Diagnosis not present

## 2021-05-14 DIAGNOSIS — F3342 Major depressive disorder, recurrent, in full remission: Secondary | ICD-10-CM | POA: Diagnosis not present

## 2021-05-14 DIAGNOSIS — F410 Panic disorder [episodic paroxysmal anxiety] without agoraphobia: Secondary | ICD-10-CM | POA: Diagnosis not present

## 2021-06-28 DIAGNOSIS — Z01419 Encounter for gynecological examination (general) (routine) without abnormal findings: Secondary | ICD-10-CM | POA: Diagnosis not present

## 2021-06-28 DIAGNOSIS — Z124 Encounter for screening for malignant neoplasm of cervix: Secondary | ICD-10-CM | POA: Diagnosis not present

## 2021-06-28 DIAGNOSIS — N920 Excessive and frequent menstruation with regular cycle: Secondary | ICD-10-CM | POA: Diagnosis not present

## 2021-07-20 DIAGNOSIS — M545 Low back pain, unspecified: Secondary | ICD-10-CM | POA: Diagnosis not present

## 2021-09-10 ENCOUNTER — Other Ambulatory Visit: Payer: Self-pay | Admitting: Obstetrics and Gynecology

## 2021-09-10 DIAGNOSIS — N840 Polyp of corpus uteri: Secondary | ICD-10-CM | POA: Diagnosis not present

## 2021-09-10 DIAGNOSIS — N920 Excessive and frequent menstruation with regular cycle: Secondary | ICD-10-CM | POA: Diagnosis not present

## 2021-09-10 DIAGNOSIS — D259 Leiomyoma of uterus, unspecified: Secondary | ICD-10-CM | POA: Diagnosis not present

## 2021-09-10 DIAGNOSIS — Z01411 Encounter for gynecological examination (general) (routine) with abnormal findings: Secondary | ICD-10-CM | POA: Diagnosis not present

## 2021-09-20 DIAGNOSIS — Z0000 Encounter for general adult medical examination without abnormal findings: Secondary | ICD-10-CM | POA: Diagnosis not present

## 2021-09-20 DIAGNOSIS — Z13220 Encounter for screening for lipoid disorders: Secondary | ICD-10-CM | POA: Diagnosis not present

## 2021-09-20 DIAGNOSIS — R7303 Prediabetes: Secondary | ICD-10-CM | POA: Diagnosis not present

## 2021-10-08 DIAGNOSIS — F3341 Major depressive disorder, recurrent, in partial remission: Secondary | ICD-10-CM | POA: Diagnosis not present

## 2021-10-08 DIAGNOSIS — G4733 Obstructive sleep apnea (adult) (pediatric): Secondary | ICD-10-CM | POA: Diagnosis not present

## 2021-10-15 DIAGNOSIS — E1169 Type 2 diabetes mellitus with other specified complication: Secondary | ICD-10-CM | POA: Diagnosis not present

## 2021-10-22 DIAGNOSIS — S91301A Unspecified open wound, right foot, initial encounter: Secondary | ICD-10-CM | POA: Diagnosis not present

## 2021-10-22 DIAGNOSIS — G4733 Obstructive sleep apnea (adult) (pediatric): Secondary | ICD-10-CM | POA: Diagnosis not present

## 2021-10-25 DIAGNOSIS — J069 Acute upper respiratory infection, unspecified: Secondary | ICD-10-CM | POA: Diagnosis not present

## 2021-10-25 DIAGNOSIS — S91102D Unspecified open wound of left great toe without damage to nail, subsequent encounter: Secondary | ICD-10-CM | POA: Diagnosis not present

## 2021-11-12 DIAGNOSIS — F410 Panic disorder [episodic paroxysmal anxiety] without agoraphobia: Secondary | ICD-10-CM | POA: Diagnosis not present

## 2021-11-12 DIAGNOSIS — F900 Attention-deficit hyperactivity disorder, predominantly inattentive type: Secondary | ICD-10-CM | POA: Diagnosis not present

## 2021-11-12 DIAGNOSIS — F3342 Major depressive disorder, recurrent, in full remission: Secondary | ICD-10-CM | POA: Diagnosis not present

## 2021-11-22 DIAGNOSIS — G4733 Obstructive sleep apnea (adult) (pediatric): Secondary | ICD-10-CM | POA: Diagnosis not present

## 2021-12-17 DIAGNOSIS — Z903 Acquired absence of stomach [part of]: Secondary | ICD-10-CM | POA: Diagnosis not present

## 2021-12-17 DIAGNOSIS — E1169 Type 2 diabetes mellitus with other specified complication: Secondary | ICD-10-CM | POA: Diagnosis not present

## 2021-12-22 DIAGNOSIS — G4733 Obstructive sleep apnea (adult) (pediatric): Secondary | ICD-10-CM | POA: Diagnosis not present

## 2022-03-01 DIAGNOSIS — L299 Pruritus, unspecified: Secondary | ICD-10-CM | POA: Diagnosis not present

## 2022-03-01 DIAGNOSIS — J3089 Other allergic rhinitis: Secondary | ICD-10-CM | POA: Diagnosis not present

## 2022-03-01 DIAGNOSIS — R052 Subacute cough: Secondary | ICD-10-CM | POA: Diagnosis not present

## 2022-03-01 DIAGNOSIS — J301 Allergic rhinitis due to pollen: Secondary | ICD-10-CM | POA: Diagnosis not present

## 2022-03-06 DIAGNOSIS — H40013 Open angle with borderline findings, low risk, bilateral: Secondary | ICD-10-CM | POA: Diagnosis not present

## 2022-03-06 DIAGNOSIS — E119 Type 2 diabetes mellitus without complications: Secondary | ICD-10-CM | POA: Diagnosis not present

## 2022-05-02 DIAGNOSIS — F331 Major depressive disorder, recurrent, moderate: Secondary | ICD-10-CM | POA: Diagnosis not present

## 2022-05-02 DIAGNOSIS — F410 Panic disorder [episodic paroxysmal anxiety] without agoraphobia: Secondary | ICD-10-CM | POA: Diagnosis not present

## 2022-05-02 DIAGNOSIS — F900 Attention-deficit hyperactivity disorder, predominantly inattentive type: Secondary | ICD-10-CM | POA: Diagnosis not present

## 2022-06-27 DIAGNOSIS — F900 Attention-deficit hyperactivity disorder, predominantly inattentive type: Secondary | ICD-10-CM | POA: Diagnosis not present

## 2022-06-27 DIAGNOSIS — F410 Panic disorder [episodic paroxysmal anxiety] without agoraphobia: Secondary | ICD-10-CM | POA: Diagnosis not present

## 2022-06-27 DIAGNOSIS — F3341 Major depressive disorder, recurrent, in partial remission: Secondary | ICD-10-CM | POA: Diagnosis not present

## 2022-07-10 DIAGNOSIS — F332 Major depressive disorder, recurrent severe without psychotic features: Secondary | ICD-10-CM | POA: Diagnosis not present

## 2022-07-12 DIAGNOSIS — M545 Low back pain, unspecified: Secondary | ICD-10-CM | POA: Diagnosis not present

## 2022-07-17 DIAGNOSIS — F332 Major depressive disorder, recurrent severe without psychotic features: Secondary | ICD-10-CM | POA: Diagnosis not present

## 2022-07-18 DIAGNOSIS — Z6841 Body Mass Index (BMI) 40.0 and over, adult: Secondary | ICD-10-CM | POA: Diagnosis not present

## 2022-07-18 DIAGNOSIS — N920 Excessive and frequent menstruation with regular cycle: Secondary | ICD-10-CM | POA: Diagnosis not present

## 2022-07-18 DIAGNOSIS — D259 Leiomyoma of uterus, unspecified: Secondary | ICD-10-CM | POA: Diagnosis not present

## 2022-07-18 DIAGNOSIS — Z01411 Encounter for gynecological examination (general) (routine) with abnormal findings: Secondary | ICD-10-CM | POA: Diagnosis not present

## 2022-07-31 DIAGNOSIS — F332 Major depressive disorder, recurrent severe without psychotic features: Secondary | ICD-10-CM | POA: Diagnosis not present

## 2022-08-02 DIAGNOSIS — F332 Major depressive disorder, recurrent severe without psychotic features: Secondary | ICD-10-CM | POA: Diagnosis not present

## 2022-08-07 DIAGNOSIS — F332 Major depressive disorder, recurrent severe without psychotic features: Secondary | ICD-10-CM | POA: Diagnosis not present

## 2022-08-13 DIAGNOSIS — F332 Major depressive disorder, recurrent severe without psychotic features: Secondary | ICD-10-CM | POA: Diagnosis not present

## 2022-08-20 DIAGNOSIS — F332 Major depressive disorder, recurrent severe without psychotic features: Secondary | ICD-10-CM | POA: Diagnosis not present

## 2022-08-23 DIAGNOSIS — F332 Major depressive disorder, recurrent severe without psychotic features: Secondary | ICD-10-CM | POA: Diagnosis not present

## 2022-08-29 DIAGNOSIS — F332 Major depressive disorder, recurrent severe without psychotic features: Secondary | ICD-10-CM | POA: Diagnosis not present

## 2022-09-04 DIAGNOSIS — F332 Major depressive disorder, recurrent severe without psychotic features: Secondary | ICD-10-CM | POA: Diagnosis not present

## 2022-09-10 DIAGNOSIS — F332 Major depressive disorder, recurrent severe without psychotic features: Secondary | ICD-10-CM | POA: Diagnosis not present

## 2022-09-18 DIAGNOSIS — F332 Major depressive disorder, recurrent severe without psychotic features: Secondary | ICD-10-CM | POA: Diagnosis not present

## 2022-09-20 DIAGNOSIS — F332 Major depressive disorder, recurrent severe without psychotic features: Secondary | ICD-10-CM | POA: Diagnosis not present

## 2022-09-24 DIAGNOSIS — F332 Major depressive disorder, recurrent severe without psychotic features: Secondary | ICD-10-CM | POA: Diagnosis not present

## 2022-10-01 DIAGNOSIS — U071 COVID-19: Secondary | ICD-10-CM | POA: Diagnosis not present

## 2022-10-09 DIAGNOSIS — F332 Major depressive disorder, recurrent severe without psychotic features: Secondary | ICD-10-CM | POA: Diagnosis not present

## 2022-10-11 DIAGNOSIS — F332 Major depressive disorder, recurrent severe without psychotic features: Secondary | ICD-10-CM | POA: Diagnosis not present

## 2022-10-14 DIAGNOSIS — F332 Major depressive disorder, recurrent severe without psychotic features: Secondary | ICD-10-CM | POA: Diagnosis not present

## 2022-10-18 DIAGNOSIS — F332 Major depressive disorder, recurrent severe without psychotic features: Secondary | ICD-10-CM | POA: Diagnosis not present

## 2022-10-21 DIAGNOSIS — I10 Essential (primary) hypertension: Secondary | ICD-10-CM | POA: Diagnosis not present

## 2022-10-21 DIAGNOSIS — F339 Major depressive disorder, recurrent, unspecified: Secondary | ICD-10-CM | POA: Diagnosis not present

## 2022-10-21 DIAGNOSIS — Z23 Encounter for immunization: Secondary | ICD-10-CM | POA: Diagnosis not present

## 2022-10-21 DIAGNOSIS — Z131 Encounter for screening for diabetes mellitus: Secondary | ICD-10-CM | POA: Diagnosis not present

## 2022-10-21 DIAGNOSIS — E1169 Type 2 diabetes mellitus with other specified complication: Secondary | ICD-10-CM | POA: Diagnosis not present

## 2022-10-21 DIAGNOSIS — Z0000 Encounter for general adult medical examination without abnormal findings: Secondary | ICD-10-CM | POA: Diagnosis not present

## 2022-10-21 DIAGNOSIS — J301 Allergic rhinitis due to pollen: Secondary | ICD-10-CM | POA: Diagnosis not present

## 2022-10-23 DIAGNOSIS — F332 Major depressive disorder, recurrent severe without psychotic features: Secondary | ICD-10-CM | POA: Diagnosis not present

## 2022-10-25 DIAGNOSIS — F332 Major depressive disorder, recurrent severe without psychotic features: Secondary | ICD-10-CM | POA: Diagnosis not present

## 2022-10-29 DIAGNOSIS — F332 Major depressive disorder, recurrent severe without psychotic features: Secondary | ICD-10-CM | POA: Diagnosis not present

## 2022-11-01 DIAGNOSIS — F332 Major depressive disorder, recurrent severe without psychotic features: Secondary | ICD-10-CM | POA: Diagnosis not present

## 2022-11-04 DIAGNOSIS — F332 Major depressive disorder, recurrent severe without psychotic features: Secondary | ICD-10-CM | POA: Diagnosis not present

## 2022-11-05 DIAGNOSIS — E1165 Type 2 diabetes mellitus with hyperglycemia: Secondary | ICD-10-CM | POA: Diagnosis not present

## 2022-11-05 DIAGNOSIS — F3341 Major depressive disorder, recurrent, in partial remission: Secondary | ICD-10-CM | POA: Diagnosis not present

## 2022-11-18 DIAGNOSIS — F332 Major depressive disorder, recurrent severe without psychotic features: Secondary | ICD-10-CM | POA: Diagnosis not present

## 2022-11-20 DIAGNOSIS — F332 Major depressive disorder, recurrent severe without psychotic features: Secondary | ICD-10-CM | POA: Diagnosis not present

## 2022-11-26 DIAGNOSIS — F332 Major depressive disorder, recurrent severe without psychotic features: Secondary | ICD-10-CM | POA: Diagnosis not present

## 2022-11-28 DIAGNOSIS — F40248 Other situational type phobia: Secondary | ICD-10-CM | POA: Diagnosis not present

## 2022-11-28 DIAGNOSIS — F900 Attention-deficit hyperactivity disorder, predominantly inattentive type: Secondary | ICD-10-CM | POA: Diagnosis not present

## 2022-11-28 DIAGNOSIS — F3341 Major depressive disorder, recurrent, in partial remission: Secondary | ICD-10-CM | POA: Diagnosis not present

## 2022-11-29 DIAGNOSIS — F332 Major depressive disorder, recurrent severe without psychotic features: Secondary | ICD-10-CM | POA: Diagnosis not present

## 2022-12-02 DIAGNOSIS — F332 Major depressive disorder, recurrent severe without psychotic features: Secondary | ICD-10-CM | POA: Diagnosis not present

## 2022-12-06 DIAGNOSIS — F332 Major depressive disorder, recurrent severe without psychotic features: Secondary | ICD-10-CM | POA: Diagnosis not present

## 2022-12-10 DIAGNOSIS — F332 Major depressive disorder, recurrent severe without psychotic features: Secondary | ICD-10-CM | POA: Diagnosis not present

## 2022-12-12 DIAGNOSIS — F332 Major depressive disorder, recurrent severe without psychotic features: Secondary | ICD-10-CM | POA: Diagnosis not present

## 2022-12-16 DIAGNOSIS — F332 Major depressive disorder, recurrent severe without psychotic features: Secondary | ICD-10-CM | POA: Diagnosis not present

## 2022-12-20 DIAGNOSIS — F332 Major depressive disorder, recurrent severe without psychotic features: Secondary | ICD-10-CM | POA: Diagnosis not present

## 2022-12-23 DIAGNOSIS — F332 Major depressive disorder, recurrent severe without psychotic features: Secondary | ICD-10-CM | POA: Diagnosis not present

## 2022-12-25 DIAGNOSIS — F332 Major depressive disorder, recurrent severe without psychotic features: Secondary | ICD-10-CM | POA: Diagnosis not present

## 2022-12-31 DIAGNOSIS — F332 Major depressive disorder, recurrent severe without psychotic features: Secondary | ICD-10-CM | POA: Diagnosis not present

## 2023-01-03 DIAGNOSIS — F332 Major depressive disorder, recurrent severe without psychotic features: Secondary | ICD-10-CM | POA: Diagnosis not present

## 2023-01-07 DIAGNOSIS — F332 Major depressive disorder, recurrent severe without psychotic features: Secondary | ICD-10-CM | POA: Diagnosis not present

## 2023-01-10 DIAGNOSIS — F332 Major depressive disorder, recurrent severe without psychotic features: Secondary | ICD-10-CM | POA: Diagnosis not present

## 2023-01-13 DIAGNOSIS — F332 Major depressive disorder, recurrent severe without psychotic features: Secondary | ICD-10-CM | POA: Diagnosis not present

## 2023-01-17 DIAGNOSIS — F332 Major depressive disorder, recurrent severe without psychotic features: Secondary | ICD-10-CM | POA: Diagnosis not present

## 2023-01-20 DIAGNOSIS — F332 Major depressive disorder, recurrent severe without psychotic features: Secondary | ICD-10-CM | POA: Diagnosis not present

## 2023-01-21 DIAGNOSIS — F332 Major depressive disorder, recurrent severe without psychotic features: Secondary | ICD-10-CM | POA: Diagnosis not present

## 2023-01-27 DIAGNOSIS — F332 Major depressive disorder, recurrent severe without psychotic features: Secondary | ICD-10-CM | POA: Diagnosis not present

## 2023-01-30 DIAGNOSIS — F332 Major depressive disorder, recurrent severe without psychotic features: Secondary | ICD-10-CM | POA: Diagnosis not present

## 2023-02-03 DIAGNOSIS — F332 Major depressive disorder, recurrent severe without psychotic features: Secondary | ICD-10-CM | POA: Diagnosis not present

## 2023-02-04 DIAGNOSIS — E1169 Type 2 diabetes mellitus with other specified complication: Secondary | ICD-10-CM | POA: Diagnosis not present

## 2023-02-06 DIAGNOSIS — F332 Major depressive disorder, recurrent severe without psychotic features: Secondary | ICD-10-CM | POA: Diagnosis not present

## 2023-02-06 DIAGNOSIS — F411 Generalized anxiety disorder: Secondary | ICD-10-CM | POA: Diagnosis not present

## 2023-02-06 DIAGNOSIS — F900 Attention-deficit hyperactivity disorder, predominantly inattentive type: Secondary | ICD-10-CM | POA: Diagnosis not present

## 2023-02-10 DIAGNOSIS — F332 Major depressive disorder, recurrent severe without psychotic features: Secondary | ICD-10-CM | POA: Diagnosis not present

## 2023-02-11 DIAGNOSIS — F900 Attention-deficit hyperactivity disorder, predominantly inattentive type: Secondary | ICD-10-CM | POA: Diagnosis not present

## 2023-02-11 DIAGNOSIS — F411 Generalized anxiety disorder: Secondary | ICD-10-CM | POA: Diagnosis not present

## 2023-02-11 DIAGNOSIS — F332 Major depressive disorder, recurrent severe without psychotic features: Secondary | ICD-10-CM | POA: Diagnosis not present

## 2023-02-17 DIAGNOSIS — F332 Major depressive disorder, recurrent severe without psychotic features: Secondary | ICD-10-CM | POA: Diagnosis not present

## 2023-02-21 DIAGNOSIS — F332 Major depressive disorder, recurrent severe without psychotic features: Secondary | ICD-10-CM | POA: Diagnosis not present

## 2023-02-24 DIAGNOSIS — F332 Major depressive disorder, recurrent severe without psychotic features: Secondary | ICD-10-CM | POA: Diagnosis not present

## 2023-02-25 DIAGNOSIS — F419 Anxiety disorder, unspecified: Secondary | ICD-10-CM | POA: Diagnosis not present

## 2023-02-25 DIAGNOSIS — N912 Amenorrhea, unspecified: Secondary | ICD-10-CM | POA: Diagnosis not present

## 2023-02-25 DIAGNOSIS — Z1331 Encounter for screening for depression: Secondary | ICD-10-CM | POA: Diagnosis not present

## 2023-02-25 DIAGNOSIS — R110 Nausea: Secondary | ICD-10-CM | POA: Diagnosis not present

## 2023-02-26 DIAGNOSIS — F411 Generalized anxiety disorder: Secondary | ICD-10-CM | POA: Diagnosis not present

## 2023-02-26 DIAGNOSIS — J3089 Other allergic rhinitis: Secondary | ICD-10-CM | POA: Diagnosis not present

## 2023-02-26 DIAGNOSIS — F900 Attention-deficit hyperactivity disorder, predominantly inattentive type: Secondary | ICD-10-CM | POA: Diagnosis not present

## 2023-02-26 DIAGNOSIS — F332 Major depressive disorder, recurrent severe without psychotic features: Secondary | ICD-10-CM | POA: Diagnosis not present

## 2023-02-26 DIAGNOSIS — R052 Subacute cough: Secondary | ICD-10-CM | POA: Diagnosis not present

## 2023-02-26 DIAGNOSIS — L299 Pruritus, unspecified: Secondary | ICD-10-CM | POA: Diagnosis not present

## 2023-02-26 DIAGNOSIS — J301 Allergic rhinitis due to pollen: Secondary | ICD-10-CM | POA: Diagnosis not present

## 2023-02-27 DIAGNOSIS — F332 Major depressive disorder, recurrent severe without psychotic features: Secondary | ICD-10-CM | POA: Diagnosis not present

## 2023-03-04 DIAGNOSIS — F332 Major depressive disorder, recurrent severe without psychotic features: Secondary | ICD-10-CM | POA: Diagnosis not present

## 2023-03-06 DIAGNOSIS — F3341 Major depressive disorder, recurrent, in partial remission: Secondary | ICD-10-CM | POA: Diagnosis not present

## 2023-03-06 DIAGNOSIS — F410 Panic disorder [episodic paroxysmal anxiety] without agoraphobia: Secondary | ICD-10-CM | POA: Diagnosis not present

## 2023-03-06 DIAGNOSIS — F900 Attention-deficit hyperactivity disorder, predominantly inattentive type: Secondary | ICD-10-CM | POA: Diagnosis not present

## 2023-03-07 ENCOUNTER — Telehealth: Payer: Self-pay

## 2023-03-07 DIAGNOSIS — F332 Major depressive disorder, recurrent severe without psychotic features: Secondary | ICD-10-CM | POA: Diagnosis not present

## 2023-03-10 DIAGNOSIS — F332 Major depressive disorder, recurrent severe without psychotic features: Secondary | ICD-10-CM | POA: Diagnosis not present

## 2023-03-11 DIAGNOSIS — J301 Allergic rhinitis due to pollen: Secondary | ICD-10-CM | POA: Diagnosis not present

## 2023-03-12 DIAGNOSIS — J3089 Other allergic rhinitis: Secondary | ICD-10-CM | POA: Diagnosis not present

## 2023-03-12 DIAGNOSIS — F332 Major depressive disorder, recurrent severe without psychotic features: Secondary | ICD-10-CM | POA: Diagnosis not present

## 2023-03-18 DIAGNOSIS — F332 Major depressive disorder, recurrent severe without psychotic features: Secondary | ICD-10-CM | POA: Diagnosis not present

## 2023-03-18 DIAGNOSIS — F411 Generalized anxiety disorder: Secondary | ICD-10-CM | POA: Diagnosis not present

## 2023-03-19 DIAGNOSIS — F332 Major depressive disorder, recurrent severe without psychotic features: Secondary | ICD-10-CM | POA: Diagnosis not present

## 2023-03-24 DIAGNOSIS — F332 Major depressive disorder, recurrent severe without psychotic features: Secondary | ICD-10-CM | POA: Diagnosis not present

## 2023-03-25 ENCOUNTER — Ambulatory Visit: Payer: BC Managed Care – PPO | Attending: Obstetrics and Gynecology | Admitting: Maternal & Fetal Medicine

## 2023-03-25 ENCOUNTER — Ambulatory Visit: Payer: BC Managed Care – PPO | Admitting: *Deleted

## 2023-03-25 DIAGNOSIS — F419 Anxiety disorder, unspecified: Secondary | ICD-10-CM | POA: Insufficient documentation

## 2023-03-25 DIAGNOSIS — E119 Type 2 diabetes mellitus without complications: Secondary | ICD-10-CM | POA: Insufficient documentation

## 2023-03-25 DIAGNOSIS — Z9884 Bariatric surgery status: Secondary | ICD-10-CM | POA: Diagnosis not present

## 2023-03-25 DIAGNOSIS — F909 Attention-deficit hyperactivity disorder, unspecified type: Secondary | ICD-10-CM | POA: Insufficient documentation

## 2023-03-25 DIAGNOSIS — I10 Essential (primary) hypertension: Secondary | ICD-10-CM | POA: Diagnosis not present

## 2023-03-25 DIAGNOSIS — D259 Leiomyoma of uterus, unspecified: Secondary | ICD-10-CM | POA: Insufficient documentation

## 2023-03-25 DIAGNOSIS — F32A Depression, unspecified: Secondary | ICD-10-CM | POA: Insufficient documentation

## 2023-03-25 DIAGNOSIS — Z3169 Encounter for other general counseling and advice on procreation: Secondary | ICD-10-CM

## 2023-03-25 NOTE — Progress Notes (Signed)
Preconception consult V/S:  Bp- 129 80        pulse- 107

## 2023-03-25 NOTE — Progress Notes (Addendum)
Patient information  Patient Name: [name]  Patient MRN:   [id number]  Referring practice: MFM Referring Provider: Marcelene Butte  MFM CONSULT  [name] is a 42 y.o. G1P0100 here for preconception counseling due to the following:  RE anxiety and depression medication review:  - Trintelix: While there is no evidence of congenital malformations in human studies the data is limited to small sample size.  If a alternative exists that should be considered (e.g. citalopram, sertraline, Ecitalopram or fluoxetine).  However, the risk of medication exposure is likely more beneficial than uncontrolled anxiety and depression during pregnancy and this medication can be continued during pregnancy if the patient understands our potential risks and no suitable alternative. -Benzodiazepines: There is no data to suggest these medications are associated with congenital malformations.  However, there is an increased risk of neonatal withdrawal especially with higher doses around the time of delivery.  Consider limiting use during pregnancy and avoiding use within the last 2 weeks of pregnancy if possible.  However, if the risk outweighs the benefit it can be continued throughout pregnancy. -Vyvanse: While there is no evidence to suggest structural birth defects, there is an increased risk of small for gestational age, adverse neuro cognitive effects and newborn withdrawal.  If this medication provides significant benefit, consider avoiding exposure in the first trimester and starting back with the lowest dose that is effective in the second trimester. -Ozempic: There is some evidence that this exposure during pregnancy may lead to birth defects in animal studies.  There is no reliable human data.  Therefore, it should be discontinued at least 2 months prior to pregnancy. -Topiramate: The patient is not taking this but has been on it in the past.  This is a medication I do not recommend during pregnancy due to  the risk of major congenital malformations, combined fetal losses, prenatal growth restriction, and orofacial clefts.  -Trazodone: This medication is not thought to cause congenital birth defects and should be continued if the benefit outweighs the risk.  Alternatively, if the patient is concerned about the potential for congenital birth defects that can be discontinued during the first trimester and restarted in the second trimester. -Auvelity: is not well studied in pregnancy. Due to the absence of data in pregnancy.  Additionally, it is structurally similar to ketamine which has been associated with histologic alterations in fetuses.  I recommend she find a suitable alternative with the help of her psychiatrist.  RE type 2 DM: her last A1c was less than six.  She is taking Ozempic and I discussed there is potential concern for birth defects in animal studies but there is limited data in human studies and therefore should be switched to insulin.  This should pose minimal risk to her pregnancy and is not a strict contraindication to pregnancy.  I discussed the potential risks diabetes in pregnancy and the importance of proper glycemic control prior to and during pregnancy.  I do not see any recent assessment of her kidney function and this should be done prior to pregnancy.  RE HTN: She is currently taking Linsinprol.  I discussed the potential birth defects associated with this medicine and recommend she discontinue and switch to Procardia or labetalol now and during during pregnancy.  I discussed the risks of chronic hypertension during pregnancy and the importance of proper blood pressure control.  RE history of IUFD: The patient has a history of IUFD at 30 weeks in 2015.  She reports that she  experienced heavy vaginal bleeding and when assessed there was no fetal heartbeat.  She reports that she had a workup done but does not remember what this included.  She does not think any genetic workup was  completed.  She recalls that the umbilical cord was infected.  Her current risk factors for a subsequent intrauterine fetal demise consist of: Type 2 diabetes, chronic hypertension, multiple medication exposure, advanced maternal >40, BMI >40, AA ethnicity, and prior IUFD. She does not smoke or have drug exposure.   RE history of laparoscopic gastric banding: Patient had a laparoscopic [employer] banding procedure over 9 years ago.  However, her BMI currently is still elevated which increases most obstetric complications including fetal growth restriction, stillbirth, increased risk for cesarean delivery, wound infection and poor healing  RE AMA: Since the patient is going to be at least 53 years old or older during pregnancy she is at high risk for aneuploidy.  She does not want to explore adoption or IVF at this time.  She will obtain aneuploidy screening at the time of her pregnancy.  Impression and recommendations -I discussed that this patient has no contraindications to pregnancy but there are some medications that should be discontinued for at least 1 to 2 months prior to pregnancy.  I also discussed that since she has a history of fetal demise, obesity, hypertension, diabetes and multiple medication exposure during pregnancy this would make her pregnancy high risk.  I discussed that she is at particularly high risk for preeclampsia, preterm birth, small for gestational age fetus which may result in an early birth and a prolonged NICU stay.  I also discussed that there is some risk to her medical conditions worsening during pregnancy but overall these can be managed safely for the majority of pregnancies with these complications. -Prenatal vitamin now and through pregnancy -Discontinue Vyvanse through the first trimester of pregnancy and then start the lowest dose possible throughout the remainder of pregnancy.  However if the patient needs this medicine to function and understands the potential risks  then she can continue it now and through pregnancy. -Discontinue Auvelity due to the absence of data in pregnancy.  Additionally, it is structurally similar to ketamine which has been associated with histologic alterations in fetuses.  Most SSRIs are considered acceptable during pregnancy and may be suitable alternatives.  There is also some mood stabilizers that are acceptable as well. I recommend she find a suitable alternative with the help of her psychiatrist.  -Any discontinuation of psychiatric medications should be done under the guidance of the prescribing physician to avoid precipitation of withdrawal. -Switch Ozempic to insulin (Levemir, Lantus, NovoLog etc.) now -Switch lisinopril to Procardia now -EKG -Echo -Pelvic ultrasound to assess uterine fibroids -Aspirin should be started 81 mg daily at [redacted] weeks gestation during her pregnancy to reduce the risk of preeclampsia. -Antiphospholipid labs should be drawn due to the history of stillbirth. These include lupus anticoagulant, beta-2 glycoprotein IgG/IgM and anticardiolipin antibodies IgG/IgM.  If positive these need to be confirmed over 12 weeks and only high titers are acceptable to meet the criteria. -Baseline CMP, CBC and protein creatinine ratio should be done prior to pregnancy. -There are no strict contraindications to pregnancy for this patient.  However, I recommend she discontinue the high risk medications 2 months prior to birth to avoid any exposure during pregnancy.  The patient and her husband are sexually active and understand the risk of these medications.  I recommend that they consider a form of  reliable contraception while determining which medications will be best during pregnancy. -Genetic counseling was offered to discuss carrier screening due to her history of fetal demise but she declined at this time.   Allergies as of 03/25/2023   No Known Allergies      Medication List        Accurate as of March 25, 2023  11:40 AM. If you have any questions, ask your nurse or doctor.          STOP taking these medications    amphetamine-dextroamphetamine 30 MG tablet Commonly known as: ADDERALL Stopped by: Nurse Estrellita Ludwig   Trintellix 20 MG Tabs tablet Generic drug: vortioxetine HBr Stopped by: Nurse Estrellita Ludwig       TAKE these medications    ALPRAZolam 0.5 MG tablet Commonly known as: XANAX Take 0.5 mg by mouth 3 (three) times daily as needed for anxiety.   AUVELITY PO Take 2.125 mg by mouth 2 (two) times daily.   CULTURELLE PROBIOTICS KIDS PO Take by mouth 2 (two) times daily.   folic acid 800 MCG tablet Commonly known as: FOLVITE Take 400 mcg by mouth daily.   levocetirizine 5 MG tablet Commonly known as: XYZAL Take 5 mg by mouth every evening.   lisdexamfetamine 70 MG capsule Commonly known as: VYVANSE Take 70 mg by mouth daily.   lisinopril 5 MG tablet Commonly known as: ZESTRIL Take 5 mg by mouth daily.   montelukast 10 MG tablet Commonly known as: SINGULAIR Take 10 mg by mouth at bedtime.   multivitamin with minerals tablet Take 1 tablet by mouth daily.   OZEMPIC (2 MG/DOSE) [place] Inject 2 mcg/L into the skin 2 (two) times a week.   SPRAVATO (56 MG DOSE) NA Place 84 mg into the nose 2 (two) times a week.   traZODone 50 MG tablet Commonly known as: DESYREL Take 100 mg by mouth 3 (three) times daily.        Review of Systems: A review of systems was performed and was negative except per HPI   Vitals and Physical Exam    05/12/2018    9:09 AM 04/15/2018   10:29 AM 04/15/2018   10:19 AM  Vitals with BMI  Height   5\' 9"   Weight   290 lbs  BMI   42.81  Systolic 116 126 161  Diastolic 82 70 70  Pulse 98 87 87  Sitting comfortably in the office chair Nonlabored breathing Normal rate and rhythm   Past pregnancies OB History  Gravida Para Term Preterm AB Living  1 1   1    0  SAB IAB Ectopic Multiple Live Births               # Outcome Date GA Lbr  Len/2nd Weight Sex Type Anes PTL Lv  1 Preterm 12/03/13 [redacted]w[redacted]d / 00:05 1.58 kg M Vag-Spont None  FD    I spent 60 minutes reviewing the patients chart, including labs and images as well as counseling the patient about her medical conditions. Greater than 50% of the time was spent in direct face-to-face patient counseling.  Braxton Feathers  MFM, [facility]   03/25/2023  11:40 AM

## 2023-03-27 DIAGNOSIS — F332 Major depressive disorder, recurrent severe without psychotic features: Secondary | ICD-10-CM | POA: Diagnosis not present

## 2023-03-31 DIAGNOSIS — F332 Major depressive disorder, recurrent severe without psychotic features: Secondary | ICD-10-CM | POA: Diagnosis not present

## 2023-04-02 DIAGNOSIS — J301 Allergic rhinitis due to pollen: Secondary | ICD-10-CM | POA: Diagnosis not present

## 2023-04-02 DIAGNOSIS — J3081 Allergic rhinitis due to animal (cat) (dog) hair and dander: Secondary | ICD-10-CM | POA: Diagnosis not present

## 2023-04-02 DIAGNOSIS — J3089 Other allergic rhinitis: Secondary | ICD-10-CM | POA: Diagnosis not present

## 2023-04-03 DIAGNOSIS — F332 Major depressive disorder, recurrent severe without psychotic features: Secondary | ICD-10-CM | POA: Diagnosis not present

## 2023-04-07 DIAGNOSIS — J3081 Allergic rhinitis due to animal (cat) (dog) hair and dander: Secondary | ICD-10-CM | POA: Diagnosis not present

## 2023-04-07 DIAGNOSIS — F332 Major depressive disorder, recurrent severe without psychotic features: Secondary | ICD-10-CM | POA: Diagnosis not present

## 2023-04-07 DIAGNOSIS — J301 Allergic rhinitis due to pollen: Secondary | ICD-10-CM | POA: Diagnosis not present

## 2023-04-07 DIAGNOSIS — J3089 Other allergic rhinitis: Secondary | ICD-10-CM | POA: Diagnosis not present

## 2023-04-08 DIAGNOSIS — F332 Major depressive disorder, recurrent severe without psychotic features: Secondary | ICD-10-CM | POA: Diagnosis not present

## 2023-04-10 DIAGNOSIS — F332 Major depressive disorder, recurrent severe without psychotic features: Secondary | ICD-10-CM | POA: Diagnosis not present

## 2023-04-15 DIAGNOSIS — F332 Major depressive disorder, recurrent severe without psychotic features: Secondary | ICD-10-CM | POA: Diagnosis not present

## 2023-04-16 DIAGNOSIS — F332 Major depressive disorder, recurrent severe without psychotic features: Secondary | ICD-10-CM | POA: Diagnosis not present

## 2023-04-22 DIAGNOSIS — F900 Attention-deficit hyperactivity disorder, predominantly inattentive type: Secondary | ICD-10-CM | POA: Diagnosis not present

## 2023-04-22 DIAGNOSIS — F332 Major depressive disorder, recurrent severe without psychotic features: Secondary | ICD-10-CM | POA: Diagnosis not present

## 2023-04-22 DIAGNOSIS — F411 Generalized anxiety disorder: Secondary | ICD-10-CM | POA: Diagnosis not present

## 2023-04-25 DIAGNOSIS — J301 Allergic rhinitis due to pollen: Secondary | ICD-10-CM | POA: Diagnosis not present

## 2023-04-25 DIAGNOSIS — J3081 Allergic rhinitis due to animal (cat) (dog) hair and dander: Secondary | ICD-10-CM | POA: Diagnosis not present

## 2023-04-25 DIAGNOSIS — J3089 Other allergic rhinitis: Secondary | ICD-10-CM | POA: Diagnosis not present

## 2023-04-29 DIAGNOSIS — J3081 Allergic rhinitis due to animal (cat) (dog) hair and dander: Secondary | ICD-10-CM | POA: Diagnosis not present

## 2023-04-29 DIAGNOSIS — J301 Allergic rhinitis due to pollen: Secondary | ICD-10-CM | POA: Diagnosis not present

## 2023-04-29 DIAGNOSIS — F332 Major depressive disorder, recurrent severe without psychotic features: Secondary | ICD-10-CM | POA: Diagnosis not present

## 2023-04-29 DIAGNOSIS — J3089 Other allergic rhinitis: Secondary | ICD-10-CM | POA: Diagnosis not present

## 2023-04-30 DIAGNOSIS — F332 Major depressive disorder, recurrent severe without psychotic features: Secondary | ICD-10-CM | POA: Diagnosis not present

## 2023-05-05 DIAGNOSIS — F332 Major depressive disorder, recurrent severe without psychotic features: Secondary | ICD-10-CM | POA: Diagnosis not present

## 2023-05-07 DIAGNOSIS — F332 Major depressive disorder, recurrent severe without psychotic features: Secondary | ICD-10-CM | POA: Diagnosis not present

## 2023-05-08 DIAGNOSIS — F332 Major depressive disorder, recurrent severe without psychotic features: Secondary | ICD-10-CM | POA: Diagnosis not present

## 2023-05-08 DIAGNOSIS — J3081 Allergic rhinitis due to animal (cat) (dog) hair and dander: Secondary | ICD-10-CM | POA: Diagnosis not present

## 2023-05-08 DIAGNOSIS — J3089 Other allergic rhinitis: Secondary | ICD-10-CM | POA: Diagnosis not present

## 2023-05-08 DIAGNOSIS — J301 Allergic rhinitis due to pollen: Secondary | ICD-10-CM | POA: Diagnosis not present

## 2023-05-12 DIAGNOSIS — F332 Major depressive disorder, recurrent severe without psychotic features: Secondary | ICD-10-CM | POA: Diagnosis not present

## 2023-05-13 DIAGNOSIS — M25552 Pain in left hip: Secondary | ICD-10-CM | POA: Diagnosis not present

## 2023-05-13 DIAGNOSIS — M545 Low back pain, unspecified: Secondary | ICD-10-CM | POA: Diagnosis not present

## 2023-05-14 DIAGNOSIS — F332 Major depressive disorder, recurrent severe without psychotic features: Secondary | ICD-10-CM | POA: Diagnosis not present

## 2023-05-15 DIAGNOSIS — F411 Generalized anxiety disorder: Secondary | ICD-10-CM | POA: Diagnosis not present

## 2023-05-15 DIAGNOSIS — F332 Major depressive disorder, recurrent severe without psychotic features: Secondary | ICD-10-CM | POA: Diagnosis not present

## 2023-05-15 DIAGNOSIS — J301 Allergic rhinitis due to pollen: Secondary | ICD-10-CM | POA: Diagnosis not present

## 2023-05-15 DIAGNOSIS — J3081 Allergic rhinitis due to animal (cat) (dog) hair and dander: Secondary | ICD-10-CM | POA: Diagnosis not present

## 2023-05-15 DIAGNOSIS — F900 Attention-deficit hyperactivity disorder, predominantly inattentive type: Secondary | ICD-10-CM | POA: Diagnosis not present

## 2023-05-15 DIAGNOSIS — J3089 Other allergic rhinitis: Secondary | ICD-10-CM | POA: Diagnosis not present

## 2023-05-19 DIAGNOSIS — F332 Major depressive disorder, recurrent severe without psychotic features: Secondary | ICD-10-CM | POA: Diagnosis not present

## 2023-05-19 DIAGNOSIS — J3081 Allergic rhinitis due to animal (cat) (dog) hair and dander: Secondary | ICD-10-CM | POA: Diagnosis not present

## 2023-05-19 DIAGNOSIS — J3089 Other allergic rhinitis: Secondary | ICD-10-CM | POA: Diagnosis not present

## 2023-05-19 DIAGNOSIS — J301 Allergic rhinitis due to pollen: Secondary | ICD-10-CM | POA: Diagnosis not present

## 2023-05-22 DIAGNOSIS — F332 Major depressive disorder, recurrent severe without psychotic features: Secondary | ICD-10-CM | POA: Diagnosis not present

## 2023-05-26 DIAGNOSIS — E1169 Type 2 diabetes mellitus with other specified complication: Secondary | ICD-10-CM | POA: Diagnosis not present

## 2023-05-26 DIAGNOSIS — J3081 Allergic rhinitis due to animal (cat) (dog) hair and dander: Secondary | ICD-10-CM | POA: Diagnosis not present

## 2023-05-26 DIAGNOSIS — F332 Major depressive disorder, recurrent severe without psychotic features: Secondary | ICD-10-CM | POA: Diagnosis not present

## 2023-05-26 DIAGNOSIS — J301 Allergic rhinitis due to pollen: Secondary | ICD-10-CM | POA: Diagnosis not present

## 2023-05-26 DIAGNOSIS — J3089 Other allergic rhinitis: Secondary | ICD-10-CM | POA: Diagnosis not present

## 2023-05-27 DIAGNOSIS — F332 Major depressive disorder, recurrent severe without psychotic features: Secondary | ICD-10-CM | POA: Diagnosis not present

## 2023-05-27 DIAGNOSIS — F900 Attention-deficit hyperactivity disorder, predominantly inattentive type: Secondary | ICD-10-CM | POA: Diagnosis not present

## 2023-05-27 DIAGNOSIS — F411 Generalized anxiety disorder: Secondary | ICD-10-CM | POA: Diagnosis not present

## 2023-05-28 DIAGNOSIS — M5416 Radiculopathy, lumbar region: Secondary | ICD-10-CM | POA: Diagnosis not present

## 2023-05-29 DIAGNOSIS — F332 Major depressive disorder, recurrent severe without psychotic features: Secondary | ICD-10-CM | POA: Diagnosis not present

## 2023-06-02 DIAGNOSIS — F332 Major depressive disorder, recurrent severe without psychotic features: Secondary | ICD-10-CM | POA: Diagnosis not present

## 2023-06-03 DIAGNOSIS — J3089 Other allergic rhinitis: Secondary | ICD-10-CM | POA: Diagnosis not present

## 2023-06-03 DIAGNOSIS — J301 Allergic rhinitis due to pollen: Secondary | ICD-10-CM | POA: Diagnosis not present

## 2023-06-03 DIAGNOSIS — M545 Low back pain, unspecified: Secondary | ICD-10-CM | POA: Diagnosis not present

## 2023-06-03 DIAGNOSIS — J3081 Allergic rhinitis due to animal (cat) (dog) hair and dander: Secondary | ICD-10-CM | POA: Diagnosis not present

## 2023-06-04 DIAGNOSIS — F332 Major depressive disorder, recurrent severe without psychotic features: Secondary | ICD-10-CM | POA: Diagnosis not present

## 2023-06-05 DIAGNOSIS — F3341 Major depressive disorder, recurrent, in partial remission: Secondary | ICD-10-CM | POA: Diagnosis not present

## 2023-06-05 DIAGNOSIS — F900 Attention-deficit hyperactivity disorder, predominantly inattentive type: Secondary | ICD-10-CM | POA: Diagnosis not present

## 2023-06-05 DIAGNOSIS — F410 Panic disorder [episodic paroxysmal anxiety] without agoraphobia: Secondary | ICD-10-CM | POA: Diagnosis not present

## 2023-06-09 DIAGNOSIS — J301 Allergic rhinitis due to pollen: Secondary | ICD-10-CM | POA: Diagnosis not present

## 2023-06-09 DIAGNOSIS — J3081 Allergic rhinitis due to animal (cat) (dog) hair and dander: Secondary | ICD-10-CM | POA: Diagnosis not present

## 2023-06-09 DIAGNOSIS — F332 Major depressive disorder, recurrent severe without psychotic features: Secondary | ICD-10-CM | POA: Diagnosis not present

## 2023-06-09 DIAGNOSIS — J3089 Other allergic rhinitis: Secondary | ICD-10-CM | POA: Diagnosis not present

## 2023-06-12 DIAGNOSIS — F332 Major depressive disorder, recurrent severe without psychotic features: Secondary | ICD-10-CM | POA: Diagnosis not present

## 2023-06-12 DIAGNOSIS — M5451 Vertebrogenic low back pain: Secondary | ICD-10-CM | POA: Diagnosis not present

## 2023-06-13 DIAGNOSIS — F900 Attention-deficit hyperactivity disorder, predominantly inattentive type: Secondary | ICD-10-CM | POA: Diagnosis not present

## 2023-06-13 DIAGNOSIS — F411 Generalized anxiety disorder: Secondary | ICD-10-CM | POA: Diagnosis not present

## 2023-06-13 DIAGNOSIS — F332 Major depressive disorder, recurrent severe without psychotic features: Secondary | ICD-10-CM | POA: Diagnosis not present

## 2023-06-16 DIAGNOSIS — F332 Major depressive disorder, recurrent severe without psychotic features: Secondary | ICD-10-CM | POA: Diagnosis not present

## 2023-06-19 DIAGNOSIS — F332 Major depressive disorder, recurrent severe without psychotic features: Secondary | ICD-10-CM | POA: Diagnosis not present

## 2023-06-19 DIAGNOSIS — J3081 Allergic rhinitis due to animal (cat) (dog) hair and dander: Secondary | ICD-10-CM | POA: Diagnosis not present

## 2023-06-19 DIAGNOSIS — J3089 Other allergic rhinitis: Secondary | ICD-10-CM | POA: Diagnosis not present

## 2023-06-19 DIAGNOSIS — J301 Allergic rhinitis due to pollen: Secondary | ICD-10-CM | POA: Diagnosis not present

## 2023-06-21 DIAGNOSIS — M5416 Radiculopathy, lumbar region: Secondary | ICD-10-CM | POA: Diagnosis not present

## 2023-06-23 DIAGNOSIS — J3081 Allergic rhinitis due to animal (cat) (dog) hair and dander: Secondary | ICD-10-CM | POA: Diagnosis not present

## 2023-06-23 DIAGNOSIS — J3089 Other allergic rhinitis: Secondary | ICD-10-CM | POA: Diagnosis not present

## 2023-06-23 DIAGNOSIS — F332 Major depressive disorder, recurrent severe without psychotic features: Secondary | ICD-10-CM | POA: Diagnosis not present

## 2023-06-23 DIAGNOSIS — J301 Allergic rhinitis due to pollen: Secondary | ICD-10-CM | POA: Diagnosis not present

## 2023-06-26 DIAGNOSIS — F332 Major depressive disorder, recurrent severe without psychotic features: Secondary | ICD-10-CM | POA: Diagnosis not present

## 2023-06-27 DIAGNOSIS — F900 Attention-deficit hyperactivity disorder, predominantly inattentive type: Secondary | ICD-10-CM | POA: Diagnosis not present

## 2023-06-27 DIAGNOSIS — F411 Generalized anxiety disorder: Secondary | ICD-10-CM | POA: Diagnosis not present

## 2023-06-27 DIAGNOSIS — F332 Major depressive disorder, recurrent severe without psychotic features: Secondary | ICD-10-CM | POA: Diagnosis not present

## 2023-06-27 DIAGNOSIS — M5416 Radiculopathy, lumbar region: Secondary | ICD-10-CM | POA: Diagnosis not present

## 2023-06-30 DIAGNOSIS — F332 Major depressive disorder, recurrent severe without psychotic features: Secondary | ICD-10-CM | POA: Diagnosis not present

## 2023-07-03 DIAGNOSIS — F332 Major depressive disorder, recurrent severe without psychotic features: Secondary | ICD-10-CM | POA: Diagnosis not present

## 2023-07-04 DIAGNOSIS — J3081 Allergic rhinitis due to animal (cat) (dog) hair and dander: Secondary | ICD-10-CM | POA: Diagnosis not present

## 2023-07-04 DIAGNOSIS — M5416 Radiculopathy, lumbar region: Secondary | ICD-10-CM | POA: Diagnosis not present

## 2023-07-04 DIAGNOSIS — J3089 Other allergic rhinitis: Secondary | ICD-10-CM | POA: Diagnosis not present

## 2023-07-04 DIAGNOSIS — J301 Allergic rhinitis due to pollen: Secondary | ICD-10-CM | POA: Diagnosis not present

## 2023-07-07 DIAGNOSIS — F332 Major depressive disorder, recurrent severe without psychotic features: Secondary | ICD-10-CM | POA: Diagnosis not present

## 2023-07-08 DIAGNOSIS — M5416 Radiculopathy, lumbar region: Secondary | ICD-10-CM | POA: Diagnosis not present

## 2023-07-09 DIAGNOSIS — F332 Major depressive disorder, recurrent severe without psychotic features: Secondary | ICD-10-CM | POA: Diagnosis not present

## 2023-07-15 DIAGNOSIS — M5416 Radiculopathy, lumbar region: Secondary | ICD-10-CM | POA: Diagnosis not present

## 2023-07-15 DIAGNOSIS — M5451 Vertebrogenic low back pain: Secondary | ICD-10-CM | POA: Diagnosis not present

## 2023-07-15 DIAGNOSIS — F332 Major depressive disorder, recurrent severe without psychotic features: Secondary | ICD-10-CM | POA: Diagnosis not present

## 2023-07-18 DIAGNOSIS — J3089 Other allergic rhinitis: Secondary | ICD-10-CM | POA: Diagnosis not present

## 2023-07-18 DIAGNOSIS — F332 Major depressive disorder, recurrent severe without psychotic features: Secondary | ICD-10-CM | POA: Diagnosis not present

## 2023-07-18 DIAGNOSIS — J3081 Allergic rhinitis due to animal (cat) (dog) hair and dander: Secondary | ICD-10-CM | POA: Diagnosis not present

## 2023-07-18 DIAGNOSIS — J301 Allergic rhinitis due to pollen: Secondary | ICD-10-CM | POA: Diagnosis not present

## 2023-07-21 DIAGNOSIS — F332 Major depressive disorder, recurrent severe without psychotic features: Secondary | ICD-10-CM | POA: Diagnosis not present

## 2023-07-22 DIAGNOSIS — F900 Attention-deficit hyperactivity disorder, predominantly inattentive type: Secondary | ICD-10-CM | POA: Diagnosis not present

## 2023-07-22 DIAGNOSIS — F332 Major depressive disorder, recurrent severe without psychotic features: Secondary | ICD-10-CM | POA: Diagnosis not present

## 2023-07-22 DIAGNOSIS — F411 Generalized anxiety disorder: Secondary | ICD-10-CM | POA: Diagnosis not present

## 2023-07-23 DIAGNOSIS — M5416 Radiculopathy, lumbar region: Secondary | ICD-10-CM | POA: Diagnosis not present

## 2023-07-24 DIAGNOSIS — F332 Major depressive disorder, recurrent severe without psychotic features: Secondary | ICD-10-CM | POA: Diagnosis not present

## 2023-07-25 DIAGNOSIS — J3081 Allergic rhinitis due to animal (cat) (dog) hair and dander: Secondary | ICD-10-CM | POA: Diagnosis not present

## 2023-07-25 DIAGNOSIS — J301 Allergic rhinitis due to pollen: Secondary | ICD-10-CM | POA: Diagnosis not present

## 2023-07-25 DIAGNOSIS — J3089 Other allergic rhinitis: Secondary | ICD-10-CM | POA: Diagnosis not present

## 2023-07-26 DIAGNOSIS — M5416 Radiculopathy, lumbar region: Secondary | ICD-10-CM | POA: Diagnosis not present

## 2023-07-28 DIAGNOSIS — F332 Major depressive disorder, recurrent severe without psychotic features: Secondary | ICD-10-CM | POA: Diagnosis not present

## 2023-07-30 DIAGNOSIS — M5416 Radiculopathy, lumbar region: Secondary | ICD-10-CM | POA: Diagnosis not present

## 2023-07-31 DIAGNOSIS — E781 Pure hyperglyceridemia: Secondary | ICD-10-CM | POA: Diagnosis not present

## 2023-07-31 DIAGNOSIS — E1169 Type 2 diabetes mellitus with other specified complication: Secondary | ICD-10-CM | POA: Diagnosis not present

## 2023-07-31 DIAGNOSIS — G4733 Obstructive sleep apnea (adult) (pediatric): Secondary | ICD-10-CM | POA: Diagnosis not present

## 2023-07-31 DIAGNOSIS — J019 Acute sinusitis, unspecified: Secondary | ICD-10-CM | POA: Diagnosis not present

## 2023-08-01 DIAGNOSIS — Z1231 Encounter for screening mammogram for malignant neoplasm of breast: Secondary | ICD-10-CM | POA: Diagnosis not present

## 2023-08-01 DIAGNOSIS — F332 Major depressive disorder, recurrent severe without psychotic features: Secondary | ICD-10-CM | POA: Diagnosis not present

## 2023-08-03 DIAGNOSIS — J0100 Acute maxillary sinusitis, unspecified: Secondary | ICD-10-CM | POA: Diagnosis not present

## 2023-08-08 DIAGNOSIS — F411 Generalized anxiety disorder: Secondary | ICD-10-CM | POA: Diagnosis not present

## 2023-08-08 DIAGNOSIS — F900 Attention-deficit hyperactivity disorder, predominantly inattentive type: Secondary | ICD-10-CM | POA: Diagnosis not present

## 2023-08-08 DIAGNOSIS — F332 Major depressive disorder, recurrent severe without psychotic features: Secondary | ICD-10-CM | POA: Diagnosis not present

## 2023-08-12 DIAGNOSIS — M5416 Radiculopathy, lumbar region: Secondary | ICD-10-CM | POA: Diagnosis not present

## 2023-08-18 DIAGNOSIS — E1169 Type 2 diabetes mellitus with other specified complication: Secondary | ICD-10-CM | POA: Diagnosis not present

## 2023-08-18 DIAGNOSIS — J329 Chronic sinusitis, unspecified: Secondary | ICD-10-CM | POA: Diagnosis not present

## 2023-08-18 DIAGNOSIS — Z903 Acquired absence of stomach [part of]: Secondary | ICD-10-CM | POA: Diagnosis not present

## 2023-08-25 DIAGNOSIS — F900 Attention-deficit hyperactivity disorder, predominantly inattentive type: Secondary | ICD-10-CM | POA: Diagnosis not present

## 2023-08-25 DIAGNOSIS — F411 Generalized anxiety disorder: Secondary | ICD-10-CM | POA: Diagnosis not present

## 2023-08-25 DIAGNOSIS — F332 Major depressive disorder, recurrent severe without psychotic features: Secondary | ICD-10-CM | POA: Diagnosis not present

## 2023-09-02 ENCOUNTER — Other Ambulatory Visit: Payer: Self-pay | Admitting: Neurosurgery

## 2023-09-04 ENCOUNTER — Other Ambulatory Visit: Payer: Self-pay | Admitting: Neurosurgery

## 2023-09-05 NOTE — Pre-Procedure Instructions (Signed)
Surgical Instructions   Your procedure is scheduled on September 12, 2023. Report to [facility] Main Entrance "A" at 10:45 A.M., then check in with the Admitting office. Any questions or running late day of surgery: call [phone]  Questions prior to your surgery date: call [phone], Monday-Friday, 8am-4pm. If you experience any cold or flu symptoms such as cough, fever, chills, shortness of breath, etc. between now and your scheduled surgery, please notify us at the above number.     Remember:  Do not eat or drink after midnight the night before your surgery    Take these medicines the morning of surgery with A SIP OF WATER: Dextromethorphan-buPROPion ER (AUVELITY)    May take these medicines IF NEEDED: albuterol (VENTOLIN HFA) inhaler  ALPRAZolam (XANAX)  EPINEPHrine Pen   One week prior to surgery, STOP taking any Aspirin (unless otherwise instructed by your surgeon) Aleve, Naproxen, Ibuprofen, Motrin, Advil, Goody's, BC's, all herbal medications, fish oil, and non-prescription vitamins.   WHAT DO I DO ABOUT MY DIABETES MEDICATION?   STOP taking your OZEMPIC one week prior to surgery. DO NOT take any doses after January 23rd.   HOW TO MANAGE YOUR DIABETES BEFORE AND AFTER SURGERY  Why is it important to control my blood sugar before and after surgery? Improving blood sugar levels before and after surgery helps healing and can limit problems. A way of improving blood sugar control is eating a healthy diet by:  Eating less sugar and carbohydrates  Increasing activity/exercise  Talking with your doctor about reaching your blood sugar goals High blood sugars (greater than 180 mg/dL) can raise your risk of infections and slow your recovery, so you will need to focus on controlling your diabetes during the weeks before surgery. Make sure that the doctor who takes care of your diabetes knows about your planned surgery including the date and location.  How do I manage my  blood sugar before surgery? Check your blood sugar at least 4 times a day, starting 2 days before surgery, to make sure that the level is not too high or low.  Check your blood sugar the morning of your surgery when you wake up and every 2 hours until you get to the Short Stay unit.  If your blood sugar is less than 70 mg/dL, you will need to treat for low blood sugar: Do not take insulin. Treat a low blood sugar (less than 70 mg/dL) with  cup of clear juice (cranberry or apple), 4 glucose tablets, OR glucose gel. Recheck blood sugar in 15 minutes after treatment (to make sure it is greater than 70 mg/dL). If your blood sugar is not greater than 70 mg/dL on recheck, call [phone] for further instructions. Report your blood sugar to the short stay nurse when you get to Short Stay.  If you are admitted to the hospital after surgery: Your blood sugar will be checked by the staff and you will probably be given insulin after surgery (instead of oral diabetes medicines) to make sure you have good blood sugar levels. The goal for blood sugar control after surgery is 80-180 mg/dL.                      Do NOT Smoke (Tobacco/Vaping) for 24 hours prior to your procedure.  If you use a CPAP at night, you may bring your mask/headgear for your overnight stay.   You will be asked to remove any contacts, glasses, piercing's, hearing aid's, dentures/partials prior to  surgery. Please bring cases for these items if needed.    Patients discharged the day of surgery will not be allowed to drive home, and someone needs to stay with them for 24 hours.  SURGICAL WAITING ROOM VISITATION Patients may have no more than 2 support people in the waiting area - these visitors may rotate.   Pre-op nurse will coordinate an appropriate time for 1 ADULT support person, who may not rotate, to accompany patient in pre-op.  Children under the age of 69 must have an adult with them who is not the patient and must remain  in the main waiting area with an adult.  If the patient needs to stay at the hospital during part of their recovery, the visitor guidelines for inpatient rooms apply.  Please refer to the [facility] website for the visitor guidelines for any additional information.   If you received a COVID test during your pre-op visit  it is requested that you wear a mask when out in public, stay away from anyone that may not be feeling well and notify your surgeon if you develop symptoms. If you have been in contact with anyone that has tested positive in the last 10 days please notify you surgeon.      Pre-operative 5 CHG Bathing Instructions   You can play a key role in reducing the risk of infection after surgery. Your skin needs to be as free of germs as possible. You can reduce the number of germs on your skin by washing with CHG (chlorhexidine gluconate) soap before surgery. CHG is an antiseptic soap that kills germs and continues to kill germs even after washing.   DO NOT use if you have an allergy to chlorhexidine/CHG or antibacterial soaps. If your skin becomes reddened or irritated, stop using the CHG and notify one of our RNs at [phone].   Please shower with the CHG soap starting 4 days before surgery using the following schedule:     Please keep in mind the following:  DO NOT shave, including legs and underarms, starting the day of your first shower.   You may shave your face at any point before/day of surgery.  Place clean sheets on your bed the day you start using CHG soap. Use a clean washcloth (not used since being washed) for each shower. DO NOT sleep with pets once you start using the CHG.   CHG Shower Instructions:  Wash your face and private area with normal soap. If you choose to wash your hair, wash first with your normal shampoo.  After you use shampoo/soap, rinse your hair and body thoroughly to remove shampoo/soap residue.  Turn the water OFF and apply about 3  tablespoons (45 ml) of CHG soap to a CLEAN washcloth.  Apply CHG soap ONLY FROM YOUR NECK DOWN TO YOUR TOES (washing for 3-5 minutes)  DO NOT use CHG soap on face, private areas, open wounds, or sores.  Pay special attention to the area where your surgery is being performed.  If you are having back surgery, having someone wash your back for you may be helpful. Wait 2 minutes after CHG soap is applied, then you may rinse off the CHG soap.  Pat dry with a clean towel  Put on clean clothes/pajamas   If you choose to wear lotion, please use ONLY the CHG-compatible lotions that are listed below.  Additional instructions for the day of surgery: DO NOT APPLY any lotions, deodorants, cologne, or perfumes.   Do  not bring valuables to the hospital. [facility] is not responsible for any belongings/valuables. Do not wear nail polish, gel polish, artificial nails, or any other type of covering on natural nails (fingers and toes) Do not wear jewelry or makeup Put on clean/comfortable clothes.  Please brush your teeth.  Ask your nurse before applying any prescription medications to the skin.     CHG Compatible Lotions   Aveeno Moisturizing lotion  Cetaphil Moisturizing Cream  Cetaphil Moisturizing Lotion  Clairol Herbal Essence Moisturizing Lotion, Dry Skin  Clairol Herbal Essence Moisturizing Lotion, Extra Dry Skin  Clairol Herbal Essence Moisturizing Lotion, Normal Skin  Curel Age Defying Therapeutic Moisturizing Lotion with Alpha Hydroxy  Curel Extreme Care Body Lotion  Curel Soothing Hands Moisturizing Hand Lotion  Curel Therapeutic Moisturizing Cream, Fragrance-Free  Curel Therapeutic Moisturizing Lotion, Fragrance-Free  Curel Therapeutic Moisturizing Lotion, Original Formula  Eucerin Daily Replenishing Lotion  Eucerin Dry Skin Therapy Plus Alpha Hydroxy Crme  Eucerin Dry Skin Therapy Plus Alpha Hydroxy Lotion  Eucerin Original Crme  Eucerin Original Lotion  Eucerin Plus Crme  Eucerin Plus Lotion  Eucerin TriLipid Replenishing Lotion  Keri Anti-Bacterial Hand Lotion  Keri Deep Conditioning Original Lotion Dry Skin Formula Softly Scented  Keri Deep Conditioning Original Lotion, Fragrance Free Sensitive Skin Formula  Keri Lotion Fast Absorbing Fragrance Free Sensitive Skin Formula  Keri Lotion Fast Absorbing Softly Scented Dry Skin Formula  Keri Original Lotion  Keri Skin Renewal Lotion Keri Silky Smooth Lotion  Keri Silky Smooth Sensitive Skin Lotion  Nivea Body Creamy Conditioning Oil  Nivea Body Extra Enriched Lotion  Nivea Body Original Lotion  Nivea Body Sheer Moisturizing Lotion Nivea Crme  Nivea Skin Firming Lotion  NutraDerm 30 Skin Lotion  NutraDerm Skin Lotion  NutraDerm Therapeutic Skin Cream  NutraDerm Therapeutic Skin Lotion  ProShield Protective Hand Cream  Provon moisturizing lotion  Please read over the following fact sheets that you were given.

## 2023-09-08 ENCOUNTER — Encounter (HOSPITAL_COMMUNITY)
Admission: RE | Admit: 2023-09-08 | Discharge: 2023-09-08 | Disposition: A | Discharge status: Home / Self Care | Payer: Medicaid Other | Source: Ambulatory Visit | Attending: Neurosurgery

## 2023-09-08 ENCOUNTER — Other Ambulatory Visit: Payer: Self-pay

## 2023-09-08 ENCOUNTER — Encounter (HOSPITAL_COMMUNITY): Payer: Self-pay

## 2023-09-08 VITALS — BP 122/57 | HR 59 | Temp 98.3°F | Resp 20 | Ht 69.0 in | Wt 308.4 lb

## 2023-09-08 DIAGNOSIS — E119 Type 2 diabetes mellitus without complications: Secondary | ICD-10-CM | POA: Diagnosis not present

## 2023-09-08 DIAGNOSIS — Z01818 Encounter for other preprocedural examination: Secondary | ICD-10-CM | POA: Insufficient documentation

## 2023-09-08 DIAGNOSIS — I2510 Atherosclerotic heart disease of native coronary artery without angina pectoris: Secondary | ICD-10-CM

## 2023-09-08 HISTORY — DX: Unspecified asthma, uncomplicated: J45.909

## 2023-09-08 LAB — BASIC METABOLIC PANEL
Anion gap: 10 (ref 5–15)
BUN: 10 mg/dL (ref 6–20)
CO2: 25 mmol/L (ref 22–32)
Calcium: 9.2 mg/dL (ref 8.9–10.3)
Chloride: 100 mmol/L (ref 98–111)
Creatinine, Ser: 1 mg/dL (ref 0.44–1.00)
GFR, Estimated: >60 mL/min (ref >60)
Glucose, Bld: 90 mg/dL (ref 70–99)
Potassium: 4.1 mmol/L (ref 3.5–5.1)
Sodium: 135 mmol/L (ref 135–145)

## 2023-09-08 LAB — CBC
HCT: 38.1 % (ref 36.0–46.0)
Hemoglobin: 12.6 g/dL (ref 12.0–15.0)
MCH: 32.4 pg (ref 26.0–34.0)
MCHC: 33.1 g/dL (ref 30.0–36.0)
MCV: 97.9 fL (ref 80.0–100.0)
Platelets: 308 10*3/uL (ref 150–400)
RBC: 3.89 MIL/uL (ref 3.87–5.11)
RDW: 11.6 % (ref 11.5–15.5)
WBC: 7.2 10*3/uL (ref 4.0–10.5)
nRBC: 0 % (ref 0.0–0.2)

## 2023-09-08 LAB — SURGICAL PCR SCREEN
MRSA, PCR: NEGATIVE
Staphylococcus aureus: NEGATIVE

## 2023-09-08 LAB — GLUCOSE, CAPILLARY: Glucose-Capillary: 81 mg/dL (ref 70–99)

## 2023-09-08 NOTE — Progress Notes (Signed)
PCP - Dr. Lorenda Ishihara Cardiologist - Denies  PPM/ICD - Denies Device Orders - n/a Rep Notified - n/a  Chest x-ray - n/a EKG - 09/08/2023 Stress Test - Denies ECHO - Per pt, had one 10+ years ago for "skipped beats." Results normal and skipped beats resolved shortly after Cardiac Cath - Denies  Sleep Study - +OSA. Pt wears CPAP nightly, but not aware of pressure settings  Pt is DM2. She used to have a CGM, but now that A1c is 5.4 insurance no longer pays for it. She does not have a glucose meter at home. When she was checking, normal fasting was 60-80. CBG 81 at pre-op appointment and that is a fasting result. Last A1c 5.4  on 08/01/2023  Last dose of GLP1 agonist-  Last dose of Ozempic January 21st GLP1 instructions: Pt instructed to NOT take her dose tomorrow/any additional doses until after surgery  Blood Thinner Instructions: n/a Aspirin Instructions: n/a  NPO after midnight  COVID TEST- n/a   Anesthesia review: No.  Patient denies shortness of breath, fever, cough and chest pain at PAT appointment. Pt was diagnosed with sinus infection 07/31/2023, but prescribed amoxicillin did not work. Follow-up appointment 12/22 she was prescribed a 7 day course of another antibiotic and completed it by the end of December which is also when she had symptom resolution   All instructions explained to the patient, with a verbal understanding of the material. Patient agrees to go over the instructions while at home for a better understanding. Patient also instructed to self quarantine after being tested for COVID-19. The opportunity to ask questions was provided.

## 2023-09-12 ENCOUNTER — Ambulatory Visit (HOSPITAL_COMMUNITY): Payer: Medicaid Other

## 2023-09-12 ENCOUNTER — Encounter (HOSPITAL_COMMUNITY): Payer: Self-pay | Admitting: Neurosurgery

## 2023-09-12 ENCOUNTER — Other Ambulatory Visit: Payer: Self-pay

## 2023-09-12 ENCOUNTER — Ambulatory Visit (HOSPITAL_COMMUNITY): Payer: Medicaid Other | Admitting: Physician Assistant

## 2023-09-12 ENCOUNTER — Encounter (HOSPITAL_COMMUNITY)
Admission: RE | Disposition: A | Discharge status: Home / Self Care | Payer: Self-pay | Source: Home / Self Care | Attending: Neurosurgery

## 2023-09-12 ENCOUNTER — Ambulatory Visit (HOSPITAL_COMMUNITY)
Admission: RE | Admit: 2023-09-12 | Discharge: 2023-09-13 | Disposition: A | Discharge status: Home / Self Care | Payer: Medicaid Other | Attending: Neurosurgery | Admitting: Neurosurgery

## 2023-09-12 DIAGNOSIS — M5126 Other intervertebral disc displacement, lumbar region: Secondary | ICD-10-CM | POA: Diagnosis present

## 2023-09-12 DIAGNOSIS — Z01818 Encounter for other preprocedural examination: Secondary | ICD-10-CM

## 2023-09-12 DIAGNOSIS — Z6841 Body Mass Index (BMI) 40.0 and over, adult: Secondary | ICD-10-CM | POA: Diagnosis not present

## 2023-09-12 DIAGNOSIS — J4489 Other specified chronic obstructive pulmonary disease: Secondary | ICD-10-CM | POA: Insufficient documentation

## 2023-09-12 DIAGNOSIS — M5127 Other intervertebral disc displacement, lumbosacral region: Secondary | ICD-10-CM | POA: Insufficient documentation

## 2023-09-12 DIAGNOSIS — I10 Essential (primary) hypertension: Secondary | ICD-10-CM | POA: Insufficient documentation

## 2023-09-12 DIAGNOSIS — G4730 Sleep apnea, unspecified: Secondary | ICD-10-CM | POA: Diagnosis not present

## 2023-09-12 DIAGNOSIS — Z7985 Long-term (current) use of injectable non-insulin antidiabetic drugs: Secondary | ICD-10-CM | POA: Diagnosis not present

## 2023-09-12 DIAGNOSIS — E119 Type 2 diabetes mellitus without complications: Secondary | ICD-10-CM | POA: Insufficient documentation

## 2023-09-12 DIAGNOSIS — E6689 Other obesity not elsewhere classified: Secondary | ICD-10-CM | POA: Insufficient documentation

## 2023-09-12 HISTORY — PX: LUMBAR LAMINECTOMY/DECOMPRESSION MICRODISCECTOMY: SHX5026

## 2023-09-12 LAB — POCT PREGNANCY, URINE: Preg Test, Ur: NEGATIVE

## 2023-09-12 LAB — CBC
HCT: 36.4 % (ref 36.0–46.0)
Hemoglobin: 12.3 g/dL (ref 12.0–15.0)
MCH: 32.8 pg (ref 26.0–34.0)
MCHC: 33.8 g/dL (ref 30.0–36.0)
MCV: 97.1 fL (ref 80.0–100.0)
Platelets: 357 10*3/uL (ref 150–400)
RBC: 3.75 MIL/uL — ABNORMAL LOW (ref 3.87–5.11)
RDW: 11.9 % (ref 11.5–15.5)
WBC: 10.9 10*3/uL — ABNORMAL HIGH (ref 4.0–10.5)
nRBC: 0 % (ref 0.0–0.2)

## 2023-09-12 LAB — GLUCOSE, CAPILLARY
Glucose-Capillary: 105 mg/dL — ABNORMAL HIGH (ref 70–99)
Glucose-Capillary: 78 mg/dL (ref 70–99)
Glucose-Capillary: 106 mg/dL — ABNORMAL HIGH (ref 70–99)
Glucose-Capillary: 148 mg/dL — ABNORMAL HIGH (ref 70–99)
Glucose-Capillary: 154 mg/dL — ABNORMAL HIGH (ref 70–99)

## 2023-09-12 LAB — CREATININE, SERUM
Creatinine, Ser: 1.14 mg/dL — ABNORMAL HIGH (ref 0.44–1.00)
GFR, Estimated: >60 mL/min (ref >60)

## 2023-09-12 SURGERY — LUMBAR LAMINECTOMY/DECOMPRESSION MICRODISCECTOMY
Anesthesia: General | Site: Spine Lumbar | Laterality: Left

## 2023-09-12 MED ORDER — CHLORHEXIDINE GLUCONATE 0.12 % MT SOLN
15.0000 mL | Freq: Once | OROMUCOSAL | Status: AC
  Administered 2023-09-12: 15 mL via OROMUCOSAL
  Filled 2023-09-12: qty 15

## 2023-09-12 MED ORDER — SUGAMMADEX SODIUM 200 MG/2ML IV SOLN
INTRAVENOUS | Status: DC | PRN
  Administered 2023-09-12: 400 mg via INTRAVENOUS

## 2023-09-12 MED ORDER — ACETAMINOPHEN 10 MG/ML IV SOLN: 1000.0000 mg | Freq: Once | INTRAVENOUS | Status: DC | PRN

## 2023-09-12 MED ORDER — MIDAZOLAM HCL 5 MG/5ML IJ SOLN
INTRAMUSCULAR | Status: DC | PRN
  Administered 2023-09-12: 2 mg via INTRAVENOUS

## 2023-09-12 MED ORDER — ONDANSETRON HCL 4 MG/2ML IJ SOLN: 4.0000 mg | Freq: Once | INTRAMUSCULAR | Status: DC | PRN

## 2023-09-12 MED ORDER — DEXMEDETOMIDINE HCL IN NACL 200 MCG/50ML IV SOLN
INTRAVENOUS | Status: DC | PRN
  Administered 2023-09-12 (×2): 8 ug via INTRAVENOUS

## 2023-09-12 MED ORDER — PROPOFOL 10 MG/ML IV BOLUS
INTRAVENOUS | Status: AC
  Filled 2023-09-12: qty 20

## 2023-09-12 MED ORDER — ONDANSETRON HCL 4 MG PO TABS
4.0000 mg | ORAL_TABLET | Freq: Four times a day (QID) | ORAL | Status: DC | PRN
Start: 2023-09-12 — End: 2023-09-13

## 2023-09-12 MED ORDER — LISDEXAMFETAMINE DIMESYLATE 70 MG PO CAPS
70.0000 mg | ORAL_CAPSULE | Freq: Every morning | ORAL | Status: DC
  Administered 2023-09-13: 70 mg via ORAL
  Filled 2023-09-12: qty 1

## 2023-09-12 MED ORDER — SODIUM CHLORIDE 0.9% FLUSH: 3.0000 mL | Freq: Two times a day (BID) | INTRAVENOUS | Status: DC

## 2023-09-12 MED ORDER — OXYCODONE HCL 5 MG/5ML PO SOLN: 5.0000 mg | Freq: Once | ORAL | Status: AC | PRN

## 2023-09-12 MED ORDER — SODIUM CHLORIDE 0.9 % IV SOLN
250.0000 mL | INTRAVENOUS | Status: DC
  Administered 2023-09-12: 250 mL via INTRAVENOUS

## 2023-09-12 MED ORDER — PHENOL 1.4 % MT LIQD: 1.0000 | OROMUCOSAL | Status: DC | PRN

## 2023-09-12 MED ORDER — FENTANYL CITRATE (PF) 250 MCG/5ML IJ SOLN
INTRAMUSCULAR | Status: AC
  Filled 2023-09-12: qty 5

## 2023-09-12 MED ORDER — FENTANYL CITRATE (PF) 250 MCG/5ML IJ SOLN
INTRAMUSCULAR | Status: DC | PRN
  Administered 2023-09-12 (×2): 50 ug via INTRAVENOUS
  Administered 2023-09-12: 100 ug via INTRAVENOUS
  Administered 2023-09-12: 50 ug via INTRAVENOUS

## 2023-09-12 MED ORDER — OXYCODONE HCL 5 MG PO TABS
5.0000 mg | ORAL_TABLET | Freq: Once | ORAL | Status: AC | PRN
  Administered 2023-09-12: 5 mg via ORAL

## 2023-09-12 MED ORDER — HEPARIN SODIUM (PORCINE) 5000 UNIT/ML IJ SOLN
5000.0000 [IU] | Freq: Three times a day (TID) | INTRAMUSCULAR | Status: DC
Start: 2023-09-13 — End: 2023-09-13
  Administered 2023-09-13: 5000 [IU] via SUBCUTANEOUS
  Filled 2023-09-12: qty 1

## 2023-09-12 MED ORDER — ONDANSETRON HCL 4 MG/2ML IJ SOLN
INTRAMUSCULAR | Status: DC | PRN
  Administered 2023-09-12: 4 mg via INTRAVENOUS

## 2023-09-12 MED ORDER — MIDAZOLAM HCL 2 MG/2ML IJ SOLN
INTRAMUSCULAR | Status: AC
  Filled 2023-09-12: qty 2

## 2023-09-12 MED ORDER — CEFAZOLIN SODIUM-DEXTROSE 3-4 GM/150ML-% IV SOLN
3.0000 g | INTRAVENOUS | Status: AC
  Administered 2023-09-12: 3 mg via INTRAVENOUS
  Filled 2023-09-12: qty 150

## 2023-09-12 MED ORDER — ALBUTEROL SULFATE HFA 108 (90 BASE) MCG/ACT IN AERS: 1.0000 | INHALATION_SPRAY | Freq: Four times a day (QID) | RESPIRATORY_TRACT | Status: DC | PRN

## 2023-09-12 MED ORDER — LACTATED RINGERS IV SOLN
INTRAVENOUS | Status: DC
Start: 2023-09-12 — End: 2023-09-12

## 2023-09-12 MED ORDER — ACETAMINOPHEN 325 MG PO TABS: 650.0000 mg | ORAL_TABLET | ORAL | Status: DC | PRN

## 2023-09-12 MED ORDER — ROCURONIUM BROMIDE 100 MG/10ML IV SOLN
INTRAVENOUS | Status: DC | PRN
  Administered 2023-09-12: 20 mg via INTRAVENOUS
  Administered 2023-09-12: 10 mg via INTRAVENOUS
  Administered 2023-09-12: 100 mg via INTRAVENOUS

## 2023-09-12 MED ORDER — THROMBIN 5000 UNITS EX SOLR
CUTANEOUS | Status: AC
  Filled 2023-09-12: qty 10000

## 2023-09-12 MED ORDER — ONDANSETRON HCL 4 MG/2ML IJ SOLN: 4.0000 mg | Freq: Four times a day (QID) | INTRAMUSCULAR | Status: DC | PRN

## 2023-09-12 MED ORDER — SCOPOLAMINE 1 MG/3DAYS TD PT72
1.0000 | MEDICATED_PATCH | TRANSDERMAL | Status: DC
Start: 2023-09-12 — End: 2023-09-12

## 2023-09-12 MED ORDER — FENTANYL CITRATE (PF) 100 MCG/2ML IJ SOLN
25.0000 ug | INTRAMUSCULAR | Status: DC | PRN
  Administered 2023-09-12 (×2): 50 ug via INTRAVENOUS

## 2023-09-12 MED ORDER — OXYCODONE HCL 5 MG PO TABS
5.0000 mg | ORAL_TABLET | ORAL | Status: DC | PRN
Start: 2023-09-12 — End: 2023-09-13

## 2023-09-12 MED ORDER — PHENYLEPHRINE HCL (PRESSORS) 10 MG/ML IV SOLN
INTRAVENOUS | Status: DC | PRN
  Administered 2023-09-12: 80 ug via INTRAVENOUS

## 2023-09-12 MED ORDER — LIDOCAINE-EPINEPHRINE 0.5 %-1:200000 IJ SOLN
INTRAMUSCULAR | Status: AC
  Filled 2023-09-12: qty 50

## 2023-09-12 MED ORDER — LIDOCAINE HCL (PF) 2 % IJ SOLN
INTRAMUSCULAR | Status: DC | PRN
  Administered 2023-09-12: 100 mg via INTRADERMAL

## 2023-09-12 MED ORDER — ORAL CARE MOUTH RINSE: 15.0000 mL | Freq: Once | OROMUCOSAL | Status: AC

## 2023-09-12 MED ORDER — MORPHINE SULFATE (PF) 2 MG/ML IV SOLN: 2.0000 mg | INTRAVENOUS | Status: DC | PRN

## 2023-09-12 MED ORDER — CHLORHEXIDINE GLUCONATE CLOTH 2 % EX PADS: 6.0000 | MEDICATED_PAD | Freq: Once | CUTANEOUS | Status: DC

## 2023-09-12 MED ORDER — PROPOFOL 10 MG/ML IV BOLUS
INTRAVENOUS | Status: DC | PRN
  Administered 2023-09-12: 200 mg via INTRAVENOUS

## 2023-09-12 MED ORDER — ESCITALOPRAM OXALATE 10 MG PO TABS
5.0000 mg | ORAL_TABLET | Freq: Every day | ORAL | Status: DC
  Administered 2023-09-12: 5 mg via ORAL
  Filled 2023-09-12: qty 1

## 2023-09-12 MED ORDER — BUPIVACAINE HCL (PF) 0.5 % IJ SOLN
INTRAMUSCULAR | Status: AC
  Filled 2023-09-12: qty 30

## 2023-09-12 MED ORDER — LIDOCAINE-EPINEPHRINE 0.5 %-1:200000 IJ SOLN
INTRAMUSCULAR | Status: DC | PRN
  Administered 2023-09-12: 10 mL

## 2023-09-12 MED ORDER — POTASSIUM CHLORIDE IN NACL 20-0.9 MEQ/L-% IV SOLN: INTRAVENOUS | Status: DC

## 2023-09-12 MED ORDER — ALUM & MAG HYDROXIDE-SIMETH 200-200-20 MG/5ML PO SUSP: 30.0000 mL | Freq: Four times a day (QID) | ORAL | Status: DC | PRN

## 2023-09-12 MED ORDER — OXYCODONE HCL 5 MG PO TABS
10.0000 mg | ORAL_TABLET | ORAL | Status: DC | PRN
  Administered 2023-09-13 (×2): 10 mg via ORAL
  Filled 2023-09-12 (×2): qty 2

## 2023-09-12 MED ORDER — ALPRAZOLAM 0.5 MG PO TABS
0.5000 mg | ORAL_TABLET | Freq: Three times a day (TID) | ORAL | Status: DC | PRN
  Administered 2023-09-13: 0.5 mg via ORAL
  Filled 2023-09-12: qty 1

## 2023-09-12 MED ORDER — SODIUM CHLORIDE 0.9% FLUSH: 3.0000 mL | INTRAVENOUS | Status: DC | PRN

## 2023-09-12 MED ORDER — BUPIVACAINE HCL (PF) 0.5 % IJ SOLN
INTRAMUSCULAR | Status: DC | PRN
  Administered 2023-09-12: 30 mL

## 2023-09-12 MED ORDER — KETOROLAC TROMETHAMINE 15 MG/ML IJ SOLN
15.0000 mg | Freq: Four times a day (QID) | INTRAMUSCULAR | Status: DC
  Administered 2023-09-12 – 2023-09-13 (×2): 15 mg via INTRAVENOUS
  Filled 2023-09-12 (×2): qty 1

## 2023-09-12 MED ORDER — THROMBIN (RECOMBINANT) 5000 UNITS EX SOLR: CUTANEOUS | Status: DC | PRN

## 2023-09-12 MED ORDER — OXYCODONE HCL 5 MG PO TABS
ORAL_TABLET | ORAL | Status: AC
  Filled 2023-09-12: qty 1

## 2023-09-12 MED ORDER — ROSUVASTATIN CALCIUM 5 MG PO TABS
5.0000 mg | ORAL_TABLET | Freq: Every evening | ORAL | Status: DC
  Administered 2023-09-12: 5 mg via ORAL
  Filled 2023-09-12: qty 1

## 2023-09-12 MED ORDER — FENTANYL CITRATE (PF) 100 MCG/2ML IJ SOLN
INTRAMUSCULAR | Status: AC
  Filled 2023-09-12: qty 2

## 2023-09-12 MED ORDER — MENTHOL 3 MG MT LOZG: 1.0000 | LOZENGE | OROMUCOSAL | Status: DC | PRN

## 2023-09-12 MED ORDER — 0.9 % SODIUM CHLORIDE (POUR BTL) OPTIME
TOPICAL | Status: DC | PRN
  Administered 2023-09-12: 1000 mL

## 2023-09-12 MED ORDER — DEXTROMETHORPHAN-BUPROPION ER 45-105 MG PO TBCR: 1.0000 | EXTENDED_RELEASE_TABLET | ORAL | Status: DC

## 2023-09-12 MED ORDER — LABETALOL HCL 5 MG/ML IV SOLN
INTRAVENOUS | Status: DC | PRN
  Administered 2023-09-12: 5 mg via INTRAVENOUS
  Administered 2023-09-12: 10 mg via INTRAVENOUS

## 2023-09-12 MED ORDER — ACETAMINOPHEN 650 MG RE SUPP: 650.0000 mg | RECTAL | Status: DC | PRN

## 2023-09-12 MED ORDER — DIAZEPAM 5 MG PO TABS: 5.0000 mg | ORAL_TABLET | Freq: Four times a day (QID) | ORAL | Status: DC | PRN

## 2023-09-12 SURGICAL SUPPLY — 39 items
BAG COUNTER SPONGE SURGICOUNT (BAG) ×2 IMPLANT
BENZOIN TINCTURE PRP APPL 2/3 (GAUZE/BANDAGES/DRESSINGS) IMPLANT
BLADE CLIPPER SURG (BLADE) IMPLANT
BUR MATCHSTICK NEURO 3.0 LAGG (BURR) ×2 IMPLANT
CANISTER SUCT 3000ML PPV (MISCELLANEOUS) ×2 IMPLANT
CLSR STERI-STRIP ANTIMIC 1/2X4 (GAUZE/BANDAGES/DRESSINGS) IMPLANT
DERMABOND ADVANCED .7 DNX12 (GAUZE/BANDAGES/DRESSINGS) ×2 IMPLANT
DRAPE LAPAROTOMY 100X72X124 (DRAPES) ×2 IMPLANT
DRAPE MICROSCOPE SLANT 54X150 (MISCELLANEOUS) ×2 IMPLANT
DRAPE SURG 17X23 STRL (DRAPES) ×2 IMPLANT
DURAPREP 26ML APPLICATOR (WOUND CARE) ×2 IMPLANT
ELECT REM PT RETURN 9FT ADLT (ELECTROSURGICAL) ×1
ELECTRODE REM PT RTRN 9FT ADLT (ELECTROSURGICAL) ×2 IMPLANT
GAUZE 4X4 16PLY ~~LOC~~+RFID DBL (SPONGE) IMPLANT
GAUZE SPONGE 4X4 12PLY STRL (GAUZE/BANDAGES/DRESSINGS) IMPLANT
GLOVE EXAM NITRILE XL STR (GLOVE) IMPLANT
GLOVE SURG LTX SZ6.5 (GLOVE) ×2 IMPLANT
GOWN STRL REUS W/ TWL LRG LVL3 (GOWN DISPOSABLE) ×4 IMPLANT
GOWN STRL REUS W/ TWL XL LVL3 (GOWN DISPOSABLE) IMPLANT
GOWN STRL REUS W/TWL 2XL LVL3 (GOWN DISPOSABLE) IMPLANT
KIT BASIN OR (CUSTOM PROCEDURE TRAY) ×2 IMPLANT
KIT TURNOVER KIT B (KITS) ×2 IMPLANT
NDL HYPO 25X1 1.5 SAFETY (NEEDLE) ×2 IMPLANT
NDL SPNL 18GX3.5 QUINCKE PK (NEEDLE) IMPLANT
NEEDLE HYPO 25X1 1.5 SAFETY (NEEDLE) ×1
NEEDLE SPNL 18GX3.5 QUINCKE PK (NEEDLE)
NS IRRIG 1000ML POUR BTL (IV SOLUTION) ×2 IMPLANT
PACK LAMINECTOMY NEURO (CUSTOM PROCEDURE TRAY) ×2 IMPLANT
PAD ARMBOARD 7.5X6 YLW CONV (MISCELLANEOUS) ×6 IMPLANT
SPIKE FLUID TRANSFER (MISCELLANEOUS) ×2 IMPLANT
SPONGE SURGIFOAM ABS GEL SZ50 (HEMOSTASIS) ×2 IMPLANT
SPONGE T-LAP 4X18 ~~LOC~~+RFID (SPONGE) IMPLANT
STRIP CLOSURE SKIN 1/2X4 (GAUZE/BANDAGES/DRESSINGS) IMPLANT
SUT VIC AB 0 CT1 18XCR BRD8 (SUTURE) ×2 IMPLANT
SUT VIC AB 2-0 CT1 18 (SUTURE) ×2 IMPLANT
SUT VIC AB 3-0 SH 8-18 (SUTURE) ×2 IMPLANT
TOWEL GREEN STERILE (TOWEL DISPOSABLE) ×2 IMPLANT
TOWEL GREEN STERILE FF (TOWEL DISPOSABLE) ×2 IMPLANT
WATER STERILE IRR 1000ML POUR (IV SOLUTION) ×2 IMPLANT

## 2023-09-12 NOTE — Transfer of Care (Signed)
Immediate Anesthesia Transfer of Care Note  Patient: [name]  Procedure(s) Performed: Left Lumbar Five-Sacral One Microdiscectomy (Left: Spine Lumbar)  Patient Location: PACU  Anesthesia Type:General  Level of Consciousness: awake, alert , oriented, patient cooperative, and responds to stimulation  Airway & Oxygen Therapy: Patient Spontanous Breathing and Patient connected to face mask  Post-op Assessment: Report given to RN, Post -op Vital signs reviewed and stable, Patient moving all extremities, Patient moving all extremities X 4, and Patient able to stick tongue midline  Post vital signs: Reviewed and stable  Last Vitals:  Vitals Value Taken Time  BP 134/85 09/12/23 1851  Temp    Pulse 87 09/12/23 1853  Resp 23 09/12/23 1853  SpO2 95 % 09/12/23 1853  Vitals shown include unfiled device data.  Last Pain:  Vitals:   09/12/23 1211  TempSrc:   PainSc: 7       Patients Stated Pain Goal: 0 (09/12/23 1211)  Complications: No notable events documented.

## 2023-09-12 NOTE — Anesthesia Procedure Notes (Signed)
Procedure Name: Intubation Date/Time: 09/12/2023 4:00 PM  Performed by: Venia Carbon, CRNAPre-anesthesia Checklist: Patient identified, Emergency Drugs available, Suction available, Patient being monitored and Timeout performed Patient Re-evaluated:Patient Re-evaluated prior to induction Oxygen Delivery Method: Circle system utilized Preoxygenation: Pre-oxygenation with 100% oxygen Induction Type: IV induction Ventilation: Mask ventilation without difficulty and Oral airway inserted - appropriate to patient size Laryngoscope Size: Mac and 3 Grade View: Grade II Tube type: Oral Tube size: 7.0 mm Number of attempts: 1 Airway Equipment and Method: Patient positioned with wedge pillow Placement Confirmation: ETT inserted through vocal cords under direct vision, positive ETCO2, CO2 detector and breath sounds checked- equal and bilateral Secured at: 22 cm Tube secured with: Tape

## 2023-09-12 NOTE — Op Note (Signed)
09/12/2023  7:07 PM  PATIENT:  [name]  43 y.o. female  PRE-OPERATIVE DIAGNOSIS:  Displacement of lumbosacral intervertebral disc, recurrent Left  POST-OPERATIVE DIAGNOSIS:  Displacement of lumbosacral intervertebral disc, recurrent Left  PROCEDURE:  Procedure(s): Left Lumbar Five-Sacral One Microdiscectomy  SURGEON:   Surgeon(s): Coletta Memos, MD  ASSISTANTS:none  ANESTHESIA:   general  EBL:  No intake/output data recorded.  BLOOD ADMINISTERED:none  CELL SAVER GIVEN:not used  COUNT:per nursing  DRAINS: none   SPECIMEN:  No Specimen  DICTATION: [name] was taken to the operating room, intubated and placed under a general anesthetic without difficulty. She was positioned prone on a Jackson table with all pressure points padded. Her back was prepped and draped in a sterile manner. I opened the skin with a 10 blade and carried the dissection down to the thoracolumbar fascia. I used both sharp dissection and the monopolar cautery to expose the lamina of S1 , and L5. I confirmed my location with an intraoperative xray.  I used the Kerrison punches, and curettes to perform the exposure of the disc space. I brought the microscope into the operative field and started the discetomy.  I cauterized epidural veins overlying the disc space then divided them sharply. I opened the disc space with a 15 blade and proceeded with the discectomy. I used pituitary rongeurs, curettes, and other instruments to remove disc material. After the discectomy was completed I inspected the S1 nerve root and felt it was well decompressed. I explored rostrally, laterally, medially, and caudally and was satisfied with the decompression. I irrigated the wound, then closed in layers. I approximated the thoracolumbar fascia, subcutaneous, and subcuticular planes with vicryl sutures. I used dermabond for a sterile dressing.   PLAN OF CARE: Admit for overnight observation  PATIENT DISPOSITION:  PACU -  hemodynamically stable.   Delay start of Pharmacological VTE agent (>24hrs) due to surgical blood loss or risk of bleeding:  no

## 2023-09-12 NOTE — Progress Notes (Signed)
Attempted to call patient's mother to update.  No answer.

## 2023-09-12 NOTE — Progress Notes (Signed)
Upon arrival to PACU, patient's oral temperature was 98.0 F.

## 2023-09-12 NOTE — H&P (Signed)
BP (!) 129/91 (BP Location: Right Arm) Comment: RN Notified  Pulse 96   Temp 98.4 F (36.9 C) (Oral)   Resp 18   Ht 5\' 9"  (1.753 m)   Wt (!) 139.7 kg   LMP 09/01/2023   SpO2 95%   BMI 45.48 kg/m  [name] comes in today.  She has a recurrent disc herniation after surgery was done in 2019 at L5-S1 on the left side.  She weighs 302 pounds Pain is 8/10.  She said this pain started again in October of last year.  She is not sure what or if anything was done, meaning to make her have this.  She has had epidural steroid x2.  She has had physical therapy.  She has been seen by a few orthopedic surgeons.  Secondary to her weight, she was told by one surgeon that he would not be doing her case. The question that was posed me is would I do the case.  She has no bowel or bladder dysfunction.  Normal muscle tone, bulk, and coordination.  Speech is clear and fluent.  PHYSICAL EXAMINATION: She is alert and oriented x4.  She answers all questions appropriately.  Memory, language, attention span, and fund of knowledge are normal.  Speech is clear.  It is also fluent.  Hearing intact to voice.  Uvula elevates in midline.  Shoulder shrug is normal.  Tongue protrudes in the midline.  She has 5/5 strength in the upper and lower extremities.  She can toe walk, heel walk with ease.  Romberg is negative.  Pupils equal, round, and reactive to light.  Full extraocular movements.  Full visual fields.  Uvula elevates in midline.  Shoulder shrug is normal.   MRI shows a recurrent disc herniation on the left at L5-S1.  All her pain is on the left.  This is consistent with her signs and symptoms. Therefore, I will offer her surgery.  As I explained to her, weight does not help her back in the future, but I see plenty of skinny people who have disc herniations also, so to make a one-to-one correlation is simply silly.  In her intake sheet, she states that pain severity is 9/10.  She says the leg and back hurt just as much as each  other.  She is unable to sit for longer than 10 minutes or stand for longer than 10 minutes or walk for longer than 10 minutes.  The pain is described as aching, burning, dull, sharp and shooting.  She feels that she has weakness in the left lower leg, numbness and paresthesias and difficulty walking.  PAST MEDICAL HISTORY: Includes hypertension, hyperlipidemia, asthma, sleep apnea, diabetes type 2, anxiety, depression, major depression disorder.  She had undergone a lumbar discectomy, surgery for tendonitis.  She has had a Lap-Band, and she has had a gastric sleeve.     She lives with her mother.  Does not use tobacco.  Does not use alcohol.     She takes Percocet, Ziban, Vyvanse, Rosuvastatin, Lisinopril, Ozempic, Xanax, Mucinex, Cyclobenzaprine, and Gabapentin, which she says does help.     She has no known drug allergies.   I have offered and Ms. Charbonnet has accepted operative decompression of a herniated disc eccentric to the left at L5-S1, where she had previous surgery.  As I told her, there may be a problem with spinal fluid leak as this is going through scar, but we will do everything possible.

## 2023-09-12 NOTE — Progress Notes (Signed)
Attempted to call report to 3C X 2 without success.    Will continue to monitor in PACU

## 2023-09-12 NOTE — Anesthesia Preprocedure Evaluation (Addendum)
Anesthesia Evaluation  Patient identified by MRN, date of birth, ID band Patient awake    Reviewed: Allergy & Precautions, NPO status , Patient's Chart, lab work & pertinent test results, reviewed documented beta blocker date and time   History of Anesthesia Complications Negative for: history of anesthetic complications  Airway Mallampati: III  TM Distance: >3 FB     Dental no notable dental hx.    Pulmonary asthma , sleep apnea , COPD,  COPD inhaler   breath sounds clear to auscultation       Cardiovascular hypertension, (-) CAD, (-) Past MI, (-) Cardiac Stents and (-) CABG  Rhythm:Regular Rate:Normal     Neuro/Psych neg Seizures PSYCHIATRIC DISORDERS Anxiety Depression     Neuromuscular disease    GI/Hepatic ,neg GERD  ,,(+) neg Cirrhosis        Endo/Other  diabetes  Class 4 obesityOzempic BMI 45.5  Renal/GU Renal disease     Musculoskeletal   Abdominal   Peds  Hematology   Anesthesia Other Findings   Reproductive/Obstetrics                             Anesthesia Physical Anesthesia Plan  ASA: 3  Anesthesia Plan: General   Post-op Pain Management: Tylenol PO (pre-op)*   Induction: Intravenous  PONV Risk Score and Plan: 3 and Ondansetron, Dexamethasone and Scopolamine patch - Pre-op  Airway Management Planned: Oral ETT and Video Laryngoscope Planned  Additional Equipment: None  Intra-op Plan:   Post-operative Plan: Extubation in OR  Informed Consent: I have reviewed the patients History and Physical, chart, labs and discussed the procedure including the risks, benefits and alternatives for the proposed anesthesia with the patient or authorized representative who has indicated his/her understanding and acceptance.     Dental advisory given  Plan Discussed with: CRNA  Anesthesia Plan Comments:         Anesthesia Quick Evaluation

## 2023-09-12 NOTE — Discharge Instructions (Signed)
Wound Care Leave incision open to air. You may shower. Do not scrub directly on incision.  Do not put any creams, lotions, or ointments on incision.  Activity Walk each and every day, increasing distance each day. No lifting greater than 8 lbs.  Avoid bending, arching, and twisting. No driving for 2 weeks; may ride as a passenger locally.  Diet Resume your normal diet.   Return to Work Will be discussed at you follow up appointment.  Call Your Doctor If Any of These Occur Redness, drainage, or swelling at the wound.  Temperature greater than 101 degrees. Severe pain not relieved by pain medication. Incision starts to come apart.  Follow Up Appt Call today for appointment in 3 weeks [phone]) or for problems.

## 2023-09-13 DIAGNOSIS — M5127 Other intervertebral disc displacement, lumbosacral region: Secondary | ICD-10-CM | POA: Diagnosis not present

## 2023-09-13 MED ORDER — DOCUSATE SODIUM 100 MG PO CAPS: 100.0000 mg | ORAL_CAPSULE | Freq: Two times a day (BID) | ORAL | 0 refills | Status: AC

## 2023-09-13 MED ORDER — CYCLOBENZAPRINE HCL 5 MG PO TABS: 5.0000 mg | ORAL_TABLET | Freq: Three times a day (TID) | ORAL | 0 refills | Status: AC | PRN

## 2023-09-13 MED ORDER — OXYCODONE HCL 5 MG PO TABS: 5.0000 mg | ORAL_TABLET | ORAL | 0 refills | Status: DC | PRN

## 2023-09-13 NOTE — Evaluation (Signed)
Occupational Therapy Evaluation and Discharge Patient Details Name: [name] MRN: [id number] DOB: [birth year] Today's Date: 09/13/2023   History of Present Illness Leanne TYYONNA SOUCY is a 43 y.o. female who was admitted following an uncomplicated L5-S1 microdiscectomy. PMH includes: hypertension, hyperlipidemia, asthma, sleep apnea, diabetes type 2, anxiety, depression, major depression disorder.   Clinical Impression   Pt admitted with the above diagnoses. At baseline, pt is independent with ADLs. Pt currently is mod I with ADLs. All education completed regarding compensatory strategies during ADLs. No further OT needs indicated; OT signing off. Pt is for d/c home this morning.         If plan is discharge home, recommend the following:      Functional Status Assessment     Equipment Recommendations  None recommended by OT    Recommendations for Other Services       Precautions / Restrictions Precautions Precautions: Back Precaution Booklet Issued: Yes (comment) Precaution Comments: no brace order Restrictions Weight Bearing Restrictions Per Provider Order: No      Mobility Bed Mobility Overal bed mobility: Modified Independent                  Transfers Overall transfer level: Modified independent Equipment used: None               General transfer comment: extra time and effort. no AD needed.      Balance Overall balance assessment: No apparent balance deficits (not formally assessed)                                         ADL either performed or assessed with clinical judgement   ADL Overall ADL's : Needs assistance/impaired Eating/Feeding: Independent;Sitting   Grooming: Modified independent;Standing   Upper Body Bathing: Set up;Sitting   Lower Body Bathing: Modified independent;Sit to/from stand   Upper Body Dressing : Set up;Sitting   Lower Body Dressing: Modified independent;Sit to/from stand   Toilet Transfer:  Modified Independent;Ambulation   Toileting- Clothing Manipulation and Hygiene: Modified independent   Tub/ Shower Transfer: Supervision/safety;Modified independent;Ambulation;Tub transfer   Functional mobility during ADLs: Modified independent General ADL Comments: Extra time with mobility and transfers.     Vision         Perception         Praxis         Pertinent Vitals/Pain Pain Assessment Pain Assessment: Faces Faces Pain Scale: Hurts a little bit Pain Location: back, mild discomfort Pain Descriptors / Indicators: Discomfort Pain Intervention(s): Monitored during session     Extremity/Trunk Assessment Upper Extremity Assessment Upper Extremity Assessment: Overall WFL for tasks assessed   Lower Extremity Assessment Lower Extremity Assessment: Overall WFL for tasks assessed   Cervical / Trunk Assessment Cervical / Trunk Assessment: Back Surgery   Communication Communication Communication: No apparent difficulties   Cognition Arousal: Alert Behavior During Therapy: WFL for tasks assessed/performed Overall Cognitive Status: Within Functional Limits for tasks assessed                                       General Comments  Pt's mother present throughout session and included in education.    Exercises     Shoulder Instructions      Home Living Family/patient expects to be discharged to:: Private residence Living Arrangements:  Parent Available Help at Discharge: Family Type of Home: House Home Access: Other (comment) (1 threshold step)     Home Layout: One level     Bathroom Shower/Tub: Chief Strategy Officer: Handicapped height     Home Equipment: Grab bars - tub/shower;Shower seat;Toilet riser          Prior Functioning/Environment Prior Level of Function : Independent/Modified Independent                        OT Problem List:        OT Treatment/Interventions:      OT Goals(Current goals can  be found in the care plan section)    OT Frequency:      Co-evaluation              AM-PAC OT "6 Clicks" Daily Activity     Outcome Measure Help from another person eating meals?: None Help from another person taking care of personal grooming?: None Help from another person toileting, which includes using toliet, bedpan, or urinal?: A Little Help from another person bathing (including washing, rinsing, drying)?: A Little Help from another person to put on and taking off regular upper body clothing?: None Help from another person to put on and taking off regular lower body clothing?: A Little 6 Click Score: 21   End of Session    Activity Tolerance: Patient tolerated treatment well Patient left: with call bell/phone within reach;with family/visitor present;Other (comment) (sitting EOB)  OT Visit Diagnosis: Pain                Time: 1610-9604 OT Time Calculation (min): 18 min Charges:  OT General Charges $OT Visit: 1 Visit OT Evaluation $OT Eval Low Complexity: 1 Low  Raynald Kemp, OT Acute Rehabilitation Services Office: [phone]   Pilar Grammes 09/13/2023, 9:57 AM

## 2023-09-13 NOTE — Progress Notes (Signed)
PT Cancellation Note  Patient Details Name: XIA STOHR MRN: [id number] DOB: [birth year]   Cancelled Treatment:    Reason Eval/Treat Not Completed: PT screened, no needs identified, will sign off. Per discussion with OT, pt is modI with all ADLs and ambulating without DME or assistance. No acute PT needs, thank you for the consult.   Vickki Muff, PT, DPT   Acute Rehabilitation Department Office [phone] Secure Chat Communication Preferred   Ronnie Derby 09/13/2023, 10:14 AM

## 2023-09-13 NOTE — Discharge Summary (Signed)
  Patient ID: [name] MRN: [id number] DOB/AGE: [birth year] 42 y.o.  Admit date: 09/12/2023 Discharge date: 09/13/2023  Admission Diagnoses: HNP (herniated nucleus pulposus), lumbar [M51.26]   Discharge Diagnoses: Same   Discharged Condition: Stable  Hospital Course:  [name] is a 43 y.o. female who was admitted following an uncomplicated L5-S1 microdiscectomy. They were recovered in PACU and transferred to the floor. Hospital course was uncomplicated. Pt stable for discharge today. Pt to f/u in office for routine post op visit. Pt is in agreement w/ plan.    Discharge Exam: Blood pressure (!) 117/58, pulse (!) 106, temperature 98.4 F (36.9 C), temperature source Oral, resp. rate 20, height 5\' 9"  (1.753 m), weight (!) 139.7 kg, last menstrual period 09/01/2023, SpO2 100%. A&O Speech fluent, appropriate Strength grossly intact BUE/BLE.  SILTx4.  Dressing c/d/I.   Disposition: Discharge disposition: 01-Home or Self Care        Allergies as of 09/13/2023   No Known Allergies      Medication List     TAKE these medications    albuterol 108 (90 Base) MCG/ACT inhaler Commonly known as: VENTOLIN HFA Inhale 1-2 puffs into the lungs every 6 (six) hours as needed for wheezing or shortness of breath.   ALPRAZolam 0.5 MG tablet Commonly known as: XANAX Take 0.5 mg by mouth 3 (three) times daily as needed for anxiety.   Auvelity 45-105 MG Tbcr Generic drug: Dextromethorphan-buPROPion ER Take 1 tablet by mouth in the morning and at bedtime.   cyclobenzaprine 5 MG tablet Commonly known as: FLEXERIL Take 1 tablet (5 mg total) by mouth 3 (three) times daily as needed for muscle spasms.   docusate sodium 100 MG capsule Commonly known as: Colace Take 1 capsule (100 mg total) by mouth 2 (two) times daily.   EPINEPHrine 0.3 mg/0.3 mL Soaj injection Commonly known as: EPI-PEN Inject 0.3 mg into the muscle as needed for anaphylaxis.   escitalopram 5 MG  tablet Commonly known as: LEXAPRO Take 5 mg by mouth at bedtime.   lisdexamfetamine 70 MG capsule Commonly known as: VYVANSE Take 70 mg by mouth in the morning.   oxyCODONE 5 MG immediate release tablet Commonly known as: Oxy IR/ROXICODONE Take 1 tablet (5 mg total) by mouth every 4 (four) hours as needed for moderate pain (pain score 4-6).   Ozempic (1 MG/DOSE) 4 MG/3ML Sopn Generic drug: Semaglutide (1 MG/DOSE) 1 mg by Subconjunctival route every Tuesday.   rosuvastatin 5 MG tablet Commonly known as: CRESTOR Take 5 mg by mouth every evening.        Follow-up Information     Coletta Memos, MD. Call.   Specialty: Neurosurgery Why: As needed, If symptoms worsen Contact information: [address] [place] [address] [phone]                 Signed: Clovis Riley 09/13/2023, 8:44 AM

## 2023-09-13 NOTE — Progress Notes (Signed)
Pt is alert and oriented. Up ambulating in hall this morning. Pt has no shows no s/s of pain or discomfort. Pt afebrile showing no s/s of infection. Incision site is C/D/I. AVS instructions completed with patient with her mother listening at bedside. Pt transported via W/C to private vehicle.

## 2023-09-13 NOTE — Care Management (Signed)
 Patient with order to DC to home today. Unit staff to provide DME needed for home.   No HH needs identified Patient will have family/ friends provide transportation home. No other TOC needs identified for DC

## 2023-09-14 MED FILL — Thrombin For Soln 5000 Unit: CUTANEOUS | Qty: 2 | Status: AC

## 2023-09-14 NOTE — Anesthesia Postprocedure Evaluation (Signed)
Anesthesia Post Note  Patient: [name]  Procedure(s) Performed: Left Lumbar Five-Sacral One Microdiscectomy (Left: Spine Lumbar)     Patient location during evaluation: PACU Anesthesia Type: General Level of consciousness: awake and alert Pain management: pain level controlled Vital Signs Assessment: post-procedure vital signs reviewed and stable Respiratory status: spontaneous breathing, nonlabored ventilation and respiratory function stable Cardiovascular status: blood pressure returned to baseline and stable Postop Assessment: no apparent nausea or vomiting Anesthetic complications: no   No notable events documented.                 Sinia Antosh

## 2023-09-15 ENCOUNTER — Encounter (HOSPITAL_COMMUNITY): Payer: Self-pay | Admitting: Neurosurgery

## 2023-09-15 LAB — GLUCOSE, CAPILLARY: Glucose-Capillary: 95 mg/dL (ref 70–99)

## 2024-06-04 ENCOUNTER — Encounter (HOSPITAL_BASED_OUTPATIENT_CLINIC_OR_DEPARTMENT_OTHER): Payer: Self-pay | Admitting: Orthopedic Surgery

## 2024-06-04 ENCOUNTER — Other Ambulatory Visit: Payer: Self-pay

## 2024-06-07 ENCOUNTER — Encounter (HOSPITAL_BASED_OUTPATIENT_CLINIC_OR_DEPARTMENT_OTHER)
Admission: RE | Admit: 2024-06-07 | Discharge: 2024-06-07 | Disposition: A | Discharge status: Home / Self Care | Source: Ambulatory Visit | Attending: Orthopedic Surgery | Admitting: Orthopedic Surgery

## 2024-06-07 DIAGNOSIS — Z01818 Encounter for other preprocedural examination: Secondary | ICD-10-CM | POA: Insufficient documentation

## 2024-06-07 LAB — BASIC METABOLIC PANEL WITH GFR
Anion gap: 11 (ref 5–15)
BUN: 12 mg/dL (ref 6–20)
CO2: 24 mmol/L (ref 22–32)
Calcium: 9 mg/dL (ref 8.9–10.3)
Chloride: 102 mmol/L (ref 98–111)
Creatinine, Ser: 0.95 mg/dL (ref 0.44–1.00)
GFR, Estimated: >60 mL/min (ref >60)
Glucose, Bld: 99 mg/dL (ref 70–99)
Potassium: 4.4 mmol/L (ref 3.5–5.1)
Sodium: 137 mmol/L (ref 135–145)

## 2024-06-07 NOTE — Progress Notes (Signed)

## 2024-06-11 ENCOUNTER — Ambulatory Visit (HOSPITAL_BASED_OUTPATIENT_CLINIC_OR_DEPARTMENT_OTHER)
Admission: RE | Admit: 2024-06-11 | Discharge: 2024-06-11 | Disposition: A | Discharge status: Home / Self Care | Attending: Orthopedic Surgery | Admitting: Orthopedic Surgery

## 2024-06-11 ENCOUNTER — Encounter (HOSPITAL_BASED_OUTPATIENT_CLINIC_OR_DEPARTMENT_OTHER): Payer: Self-pay | Admitting: Orthopedic Surgery

## 2024-06-11 ENCOUNTER — Other Ambulatory Visit: Payer: Self-pay

## 2024-06-11 ENCOUNTER — Ambulatory Visit (HOSPITAL_BASED_OUTPATIENT_CLINIC_OR_DEPARTMENT_OTHER): Admitting: Anesthesiology

## 2024-06-11 ENCOUNTER — Encounter (HOSPITAL_BASED_OUTPATIENT_CLINIC_OR_DEPARTMENT_OTHER)
Admission: RE | Disposition: A | Discharge status: Home / Self Care | Payer: Self-pay | Source: Home / Self Care | Attending: Orthopedic Surgery

## 2024-06-11 DIAGNOSIS — Z79899 Other long term (current) drug therapy: Secondary | ICD-10-CM | POA: Insufficient documentation

## 2024-06-11 DIAGNOSIS — Z7985 Long-term (current) use of injectable non-insulin antidiabetic drugs: Secondary | ICD-10-CM | POA: Diagnosis not present

## 2024-06-11 DIAGNOSIS — F909 Attention-deficit hyperactivity disorder, unspecified type: Secondary | ICD-10-CM | POA: Insufficient documentation

## 2024-06-11 DIAGNOSIS — J45909 Unspecified asthma, uncomplicated: Secondary | ICD-10-CM | POA: Diagnosis not present

## 2024-06-11 DIAGNOSIS — S83251A Bucket-handle tear of lateral meniscus, current injury, right knee, initial encounter: Secondary | ICD-10-CM | POA: Diagnosis present

## 2024-06-11 DIAGNOSIS — G4730 Sleep apnea, unspecified: Secondary | ICD-10-CM | POA: Diagnosis not present

## 2024-06-11 DIAGNOSIS — Z79890 Hormone replacement therapy (postmenopausal): Secondary | ICD-10-CM | POA: Diagnosis not present

## 2024-06-11 DIAGNOSIS — E119 Type 2 diabetes mellitus without complications: Secondary | ICD-10-CM | POA: Insufficient documentation

## 2024-06-11 DIAGNOSIS — M65961 Unspecified synovitis and tenosynovitis, right lower leg: Secondary | ICD-10-CM | POA: Diagnosis not present

## 2024-06-11 DIAGNOSIS — X58XXXA Exposure to other specified factors, initial encounter: Secondary | ICD-10-CM | POA: Diagnosis not present

## 2024-06-11 DIAGNOSIS — F419 Anxiety disorder, unspecified: Secondary | ICD-10-CM | POA: Insufficient documentation

## 2024-06-11 DIAGNOSIS — I2510 Atherosclerotic heart disease of native coronary artery without angina pectoris: Secondary | ICD-10-CM

## 2024-06-11 DIAGNOSIS — Z01818 Encounter for other preprocedural examination: Secondary | ICD-10-CM

## 2024-06-11 DIAGNOSIS — F319 Bipolar disorder, unspecified: Secondary | ICD-10-CM | POA: Insufficient documentation

## 2024-06-11 DIAGNOSIS — E039 Hypothyroidism, unspecified: Secondary | ICD-10-CM | POA: Diagnosis not present

## 2024-06-11 DIAGNOSIS — I10 Essential (primary) hypertension: Secondary | ICD-10-CM | POA: Diagnosis not present

## 2024-06-11 HISTORY — PX: KNEE ARTHROSCOPY WITH LATERAL MENISECTOMY: SHX6193

## 2024-06-11 HISTORY — DX: Bipolar disorder, unspecified: F31.9

## 2024-06-11 LAB — GLUCOSE, CAPILLARY
Glucose-Capillary: 87 mg/dL (ref 70–99)
Glucose-Capillary: 97 mg/dL (ref 70–99)

## 2024-06-11 LAB — POCT PREGNANCY, URINE: Preg Test, Ur: NEGATIVE

## 2024-06-11 SURGERY — ARTHROSCOPY, KNEE, WITH LATERAL MENISCECTOMY
Anesthesia: General | Site: Knee | Laterality: Right

## 2024-06-11 MED ORDER — CEFAZOLIN SODIUM-DEXTROSE 3-4 GM/150ML-% IV SOLN
INTRAVENOUS | Status: AC
  Filled 2024-06-11: qty 150

## 2024-06-11 MED ORDER — FENTANYL CITRATE (PF) 100 MCG/2ML IJ SOLN
INTRAMUSCULAR | Status: AC
  Filled 2024-06-11: qty 2

## 2024-06-11 MED ORDER — OXYCODONE HCL 5 MG PO TABS: 5.0000 mg | ORAL_TABLET | ORAL | 0 refills | Status: AC | PRN

## 2024-06-11 MED ORDER — CELECOXIB 200 MG PO CAPS
ORAL_CAPSULE | ORAL | Status: AC
  Filled 2024-06-11: qty 1

## 2024-06-11 MED ORDER — AMISULPRIDE (ANTIEMETIC) 5 MG/2ML IV SOLN: 10.0000 mg | Freq: Once | INTRAVENOUS | Status: DC | PRN

## 2024-06-11 MED ORDER — PROPOFOL 10 MG/ML IV BOLUS
INTRAVENOUS | Status: DC | PRN
  Administered 2024-06-11: 200 mg via INTRAVENOUS

## 2024-06-11 MED ORDER — OXYCODONE HCL 5 MG PO TABS
5.0000 mg | ORAL_TABLET | Freq: Once | ORAL | Status: AC
Start: 2024-06-11 — End: 2024-06-11
  Administered 2024-06-11: 5 mg via ORAL

## 2024-06-11 MED ORDER — CEFAZOLIN SODIUM-DEXTROSE 3-4 GM/150ML-% IV SOLN
3.0000 g | INTRAVENOUS | Status: AC
  Administered 2024-06-11: 3 g via INTRAVENOUS

## 2024-06-11 MED ORDER — GABAPENTIN 300 MG PO CAPS
ORAL_CAPSULE | ORAL | Status: AC
  Filled 2024-06-11: qty 1

## 2024-06-11 MED ORDER — FENTANYL CITRATE (PF) 100 MCG/2ML IJ SOLN
25.0000 ug | INTRAMUSCULAR | Status: DC | PRN
  Administered 2024-06-11 (×2): 50 ug via INTRAVENOUS

## 2024-06-11 MED ORDER — LIDOCAINE HCL (CARDIAC) PF 100 MG/5ML IV SOSY
PREFILLED_SYRINGE | INTRAVENOUS | Status: DC | PRN
  Administered 2024-06-11: 60 mg via INTRAVENOUS

## 2024-06-11 MED ORDER — ONDANSETRON 4 MG PO TBDP: 4.0000 mg | ORAL_TABLET | Freq: Three times a day (TID) | ORAL | 0 refills | Status: AC | PRN

## 2024-06-11 MED ORDER — OXYCODONE HCL 5 MG PO TABS
ORAL_TABLET | ORAL | Status: AC
  Filled 2024-06-11: qty 1

## 2024-06-11 MED ORDER — MIDAZOLAM HCL 2 MG/2ML IJ SOLN
INTRAMUSCULAR | Status: AC
  Filled 2024-06-11: qty 2

## 2024-06-11 MED ORDER — ACETAMINOPHEN 500 MG PO TABS
ORAL_TABLET | ORAL | Status: AC
  Filled 2024-06-11: qty 2

## 2024-06-11 MED ORDER — ONDANSETRON HCL 4 MG/2ML IJ SOLN
INTRAMUSCULAR | Status: DC | PRN
  Administered 2024-06-11: 4 mg via INTRAVENOUS

## 2024-06-11 MED ORDER — LACTATED RINGERS IV SOLN: INTRAVENOUS | Status: DC

## 2024-06-11 MED ORDER — GABAPENTIN 300 MG PO CAPS
300.0000 mg | ORAL_CAPSULE | Freq: Once | ORAL | Status: AC
  Administered 2024-06-11: 300 mg via ORAL

## 2024-06-11 MED ORDER — CELECOXIB 200 MG PO CAPS
200.0000 mg | ORAL_CAPSULE | Freq: Once | ORAL | Status: AC
  Administered 2024-06-11: 200 mg via ORAL

## 2024-06-11 MED ORDER — ONDANSETRON HCL 4 MG/2ML IJ SOLN: 4.0000 mg | Freq: Once | INTRAMUSCULAR | Status: DC | PRN

## 2024-06-11 MED ORDER — FENTANYL CITRATE (PF) 100 MCG/2ML IJ SOLN
INTRAMUSCULAR | Status: DC | PRN
  Administered 2024-06-11: 25 ug via INTRAVENOUS
  Administered 2024-06-11 (×3): 50 ug via INTRAVENOUS
  Administered 2024-06-11: 25 ug via INTRAVENOUS

## 2024-06-11 MED ORDER — BUPIVACAINE HCL 0.25 % IJ SOLN
INTRAMUSCULAR | Status: DC | PRN
  Administered 2024-06-11: 14 mL

## 2024-06-11 MED ORDER — SODIUM CHLORIDE 0.9 % IR SOLN
Status: DC | PRN
  Administered 2024-06-11: 2000 mL

## 2024-06-11 MED ORDER — ACETAMINOPHEN 500 MG PO TABS
1000.0000 mg | ORAL_TABLET | Freq: Once | ORAL | Status: AC
Start: 2024-06-11 — End: 2024-06-11
  Administered 2024-06-11: 1000 mg via ORAL

## 2024-06-11 MED ORDER — MIDAZOLAM HCL (PF) 2 MG/2ML IJ SOLN
INTRAMUSCULAR | Status: DC | PRN
  Administered 2024-06-11: 2 mg via INTRAVENOUS

## 2024-06-11 MED ORDER — DEXAMETHASONE SODIUM PHOSPHATE 4 MG/ML IJ SOLN
INTRAMUSCULAR | Status: DC | PRN
  Administered 2024-06-11: 4 mg via INTRAVENOUS

## 2024-06-11 SURGICAL SUPPLY — 23 items
BLADE SHAVER TORPEDO 4X13 (MISCELLANEOUS) ×2 IMPLANT
BNDG ELASTIC 6INX 5YD STR LF (GAUZE/BANDAGES/DRESSINGS) ×2 IMPLANT
CLSR STERI-STRIP ANTIMIC 1/2X4 (GAUZE/BANDAGES/DRESSINGS) ×2 IMPLANT
CUTTER BONE 4.0MM X 13CM (MISCELLANEOUS) IMPLANT
DRAPE U-SHAPE 47X51 STRL (DRAPES) ×2 IMPLANT
DRAPE-T ARTHROSCOPY W/POUCH (DRAPES) ×2 IMPLANT
DURAPREP 26ML APPLICATOR (WOUND CARE) ×2 IMPLANT
GAUZE PAD ABD 8X10 STRL (GAUZE/BANDAGES/DRESSINGS) ×2 IMPLANT
GAUZE SPONGE 4X4 12PLY STRL (GAUZE/BANDAGES/DRESSINGS) ×2 IMPLANT
GLOVE BIO SURGEON STRL SZ7.5 (GLOVE) ×4 IMPLANT
GLOVE BIOGEL PI IND STRL 8 (GLOVE) ×4 IMPLANT
GOWN STRL REUS W/ TWL LRG LVL3 (GOWN DISPOSABLE) ×2 IMPLANT
GOWN STRL REUS W/TWL XL LVL3 (GOWN DISPOSABLE) ×4 IMPLANT
MANIFOLD NEPTUNE II (INSTRUMENTS) ×2 IMPLANT
MAT PREVALON FULL STRYKER (MISCELLANEOUS) IMPLANT
PACK ARTHROSCOPY DSU (CUSTOM PROCEDURE TRAY) ×2 IMPLANT
PACK BASIN DAY SURGERY FS (CUSTOM PROCEDURE TRAY) ×2 IMPLANT
SUT ETHILON 4 0 PS 2 18 (SUTURE) IMPLANT
SUT MNCRL AB 3-0 PS2 18 (SUTURE) ×2 IMPLANT
TOWEL GREEN STERILE FF (TOWEL DISPOSABLE) ×2 IMPLANT
TUBE CONNECTING 20X1/4 (TUBING) IMPLANT
TUBING ARTHROSCOPY IRRIG 16FT (MISCELLANEOUS) ×2 IMPLANT
WAND ABLATOR APOLLO I90 (BUR) IMPLANT

## 2024-06-11 NOTE — Op Note (Signed)
 Surgery Date: 06/11/2024  Surgeon(s): Sharl Selinda Dover, MD  Assistant: Dayle Moores, PA-C  Assistant attestation:  PA McClung scrubbed and present for the entire procedure.  ANESTHESIA:  general, local anesthetic with quarter percent plain Marcaine  x 20 cc  FLUIDS: Per anesthesia record.   ESTIMATED BLOOD LOSS: minimal  PREOPERATIVE DIAGNOSES:  1.  Right knee bucket-handle lateral meniscus tear 2.  Right knee synovitis  POSTOPERATIVE DIAGNOSES:  same  PROCEDURES PERFORMED:  1.  Right knee arthroscopy with limited synovectomy 2.  Right knee arthroscopic partial lateral meniscectomy for bucket-handle tear  DESCRIPTION OF PROCEDURE: [name] is a 43 y.o.-year-old female with right knee lateral meniscus bucket-handle meniscus tear. Plans are to proceed with partial lateral meniscectomy and diagnostic arthroscopy with debridement as indicated. Full discussion held regarding risks benefits alternatives and complications related surgical intervention. Conservative care options reviewed. All questions answered.  The patient was identified in the preoperative holding area and the operative extremity was marked. The patient was brought to the operating room and transferred to operating table in a supine position. Satisfactory general anesthesia was induced by anesthesiology.    Standard anterolateral, anteromedial arthroscopy portals were obtained. The anteromedial portal was obtained with a spinal needle for localization under direct visualization with subsequent diagnostic findings.   Anteromedial and anterolateral chambers: mild synovitis. The synovitis was debrided with a 4.5 mm full radius shaver through both the anteromedial and lateral portals.   Suprapatellar pouch and gutters: mild synovitis or debris. Patella chondral surface: Grade 0 Trochlear chondral surface: Grade 0 Patellofemoral tracking: midline and level Medial meniscus: Intact without tear.  Medial femoral  condyle flexion bearing surface: Grade 0 Medial femoral condyle extension bearing surface: Grade 0 Medial tibial plateau: Grade 0 Anterior cruciate ligament:stable Posterior cruciate ligament:stable Lateral meniscus: Large bucket-handle tear initiating at the junction of the anterior horn and mid body, propagating all the way back to the lateral posterior root.  This was right at the red-white zone.  There was some maceration as well of the fragment in the notch..   Lateral femoral condyle flexion bearing surface: Grade 1 Lateral femoral condyle extension bearing surface: Grade 1 Lateral tibial plateau: Grade 2, diffuse along the entire lateral tibial plateau  We performed a partial lateral meniscectomy using commendation of meniscal biter as well as motorized shaver.  Initially resecting the anterior portion.  On probing, there was no capsular attachment however, to the posterior root piece thus, in total we did resect about 66% of the lateral meniscus.  After completion of synovectomy, diagnostic exam, and debridements as described, all compartments were checked and no residual debris remained. Hemostasis was achieved with the cautery wand. The portals were approximated with buried monocryl. All excess fluid was expressed from the joint.  Xeroform sterile gauze dressings were applied followed by Ace bandage and ice pack.   DISPOSITION: The patient was awakened from general anesthetic, extubated, taken to the recovery room in medically stable condition, no apparent complications. The patient may be weightbearing as tolerated to the operative lower extremity.  Range of motion of right knee as tolerated.  Standard DVT prophylaxis with 81 mg aspirin x 6 weeks.

## 2024-06-11 NOTE — Brief Op Note (Signed)
 06/11/2024  11:15 AM  PATIENT:  [name]  43 y.o. female  PRE-OPERATIVE DIAGNOSIS:  Right knee lateral bucket handle meniscus tear  POST-OPERATIVE DIAGNOSIS:  Right knee lateral bucket handle meniscus tear  PROCEDURE:  Procedure(s) with comments: ARTHROSCOPY, KNEE, WITH LATERAL MENISCECTOMY (Right) - Right knee arthroscopy with partial lateral menisectomy  SURGEON:  Surgeons and Role:    * Sharl Selinda Dover, MD - Primary  PHYSICIAN ASSISTANT: Dayle Moores, PA-C   ANESTHESIA:   local and general  EBL:  10 mL   BLOOD ADMINISTERED:none  DRAINS: none   LOCAL MEDICATIONS USED:  MARCAINE      SPECIMEN:  No Specimen  DISPOSITION OF SPECIMEN:  N/A  COUNTS:  YES  TOURNIQUET:  * No tourniquets in log *  DICTATION: .Note written in EPIC  PLAN OF CARE: Discharge to home after PACU  PATIENT DISPOSITION:  PACU - hemodynamically stable.   Delay start of Pharmacological VTE agent (>24hrs) due to surgical blood loss or risk of bleeding: not applicable

## 2024-06-11 NOTE — Anesthesia Preprocedure Evaluation (Signed)
 Anesthesia Evaluation  Patient identified by MRN, date of birth, ID band Patient awake    Reviewed: Allergy & Precautions, NPO status , Patient's Chart, lab work & pertinent test results  Airway Mallampati: III  TM Distance: >3 FB Neck ROM: Full    Dental  (+) Teeth Intact, Dental Advisory Given   Pulmonary asthma , sleep apnea    Pulmonary exam normal breath sounds clear to auscultation       Cardiovascular hypertension, Pt. on medications Normal cardiovascular exam Rhythm:Regular Rate:Normal     Neuro/Psych  PSYCHIATRIC DISORDERS Anxiety Depression Bipolar Disorder   negative neurological ROS     GI/Hepatic negative GI ROS, Neg liver ROS,,,  Endo/Other  diabetes, Type 2Hypothyroidism  Class 3 obesity  Renal/GU negative Renal ROS     Musculoskeletal Right knee lateral bucket handle meniscus tear   Abdominal   Peds  (+) ADHD Hematology negative hematology ROS (+)   Anesthesia Other Findings Day of surgery medications reviewed with the patient.  Reproductive/Obstetrics                              Anesthesia Physical Anesthesia Plan  ASA: 3  Anesthesia Plan: General   Post-op Pain Management: Tylenol  PO (pre-op)*   Induction: Intravenous  PONV Risk Score and Plan: 3 and Midazolam , Dexamethasone  and Ondansetron   Airway Management Planned: LMA  Additional Equipment:   Intra-op Plan:   Post-operative Plan: Extubation in OR  Informed Consent: I have reviewed the patients History and Physical, chart, labs and discussed the procedure including the risks, benefits and alternatives for the proposed anesthesia with the patient or authorized representative who has indicated his/her understanding and acceptance.     Dental advisory given  Plan Discussed with: CRNA  Anesthesia Plan Comments:         Anesthesia Quick Evaluation

## 2024-06-11 NOTE — Discharge Instructions (Addendum)
 Post-operative patient instructions  Knee Arthroscopy   Ice:  Place intermittent ice or cooler pack over your knee, 30 minutes on and 30 minutes off.  Continue this for the first 72 hours after surgery, then save ice for use after therapy sessions or on more active days.   Weight:  You may bear weight on your leg as your symptoms allow. DVT prevention: Perform ankle pumps as able throughout the day on the operative extremity.  Be mobile as possible with ambulation as able.  You should also take an 81 mg aspirin once per day x6 weeks. Crutches:  Use crutches (or walker) to assist in walking as needed.  May dc when comfortable. Strengthening:  Perform simple thigh squeezes (isometric quad contractions) and straight leg lifts as you are able (3 sets of 5 to 10 repetitions, 3 times a day).  For the leg lifts, have someone support under your ankle in the beginning until you have increased strength enough to do this on your own.  To help get started on thigh squeezes, place a pillow under your knee and push down on the pillow with back of knee (sometimes easier to do than with your leg fully straight). Motion:  Perform gentle knee motion as tolerated - this is gentle bending and straightening of the knee. Seated heel slides: you can start by sitting in a chair, remove your brace, and gently slide your heel back on the floor - allowing your knee to bend. Have someone help you straighten your knee (or use your other leg/foot hooked under your ankle.  Dressing:  Perform 1st dressing change at 3 days postoperative. A moderate amount of blood tinged drainage is to be expected.  So if you bleed through the dressing on the first or second day or if you have fevers, it is fine to change the dressing/check the wounds early and redress wound. Elevate your leg.  If it bleeds through again, or if the incisions are leaking frank blood, please call the office. May change dressing every 1-2 days thereafter to help watch wounds.  Can purchase Tegderm (or 70M Nexcare) water resistant dressings at local pharmacy / [employer]. Shower:  shower is ok after 3 days.  Please take shower, NO bath. Recover with gauze and ace wrap to help keep wounds protected.   Pain medication:  A narcotic pain medication has been prescribed.  Take as directed.  Typically you need narcotic pain medication more regularly during the first 3 to 5 days after surgery.  Decrease your use of the medication as the pain improves.  Narcotics can sometimes cause constipation, even after a few doses.  If you have problems with constipation, you can take an over the counter stool softener or light laxative.  If you have persistent problems, please notify your physician's office. Physical therapy: Additional activity guidelines to be provided by your physician or physical therapist at follow-up visits.  Driving: Do not recommend driving x 1-2 weeks post surgical, especially if surgery performed on right side. Should not drive while taking narcotic pain medications. It typically takes at least 2 weeks to restore sufficient neuromuscular function for normal reaction times for driving safety.  Call [phone] for questions or problems. Evenings you will be forwarded to the hospital operator.  Ask for the orthopaedic physician on call. Please call if you experience:    Redness, foul smelling, or persistent drainage from the surgical site  worsening knee pain and swelling not responsive to medication  any calf pain and  or swelling of the lower leg  temperatures greater than 101.5 F other questions or concerns   Thank you for allowing us  to be a part of your care  No tylenol  or ibuprofen  until 4:00 p.m.   Post Anesthesia Home Care Instructions  Activity: Get plenty of rest for the remainder of the day. A responsible individual must stay with you for 24 hours following the procedure.  For the next 24 hours, DO NOT: -Drive a car -Advertising copywriter -Drink alcoholic  beverages -Take any medication unless instructed by your physician -Make any legal decisions or sign important papers.  Meals: Start with liquid foods such as gelatin or soup. Progress to regular foods as tolerated. Avoid greasy, spicy, heavy foods. If nausea and/or vomiting occur, drink only clear liquids until the nausea and/or vomiting subsides. Call your physician if vomiting continues.  Special Instructions/Symptoms: Your throat may feel dry or sore from the anesthesia or the breathing tube placed in your throat during surgery. If this causes discomfort, gargle with warm salt water. The discomfort should disappear within 24 hours.  If you had a scopolamine  patch placed behind your ear for the management of post- operative nausea and/or vomiting:  1. The medication in the patch is effective for 72 hours, after which it should be removed.  Wrap patch in a tissue and discard in the trash. Wash hands thoroughly with soap and water. 2. You may remove the patch earlier than 72 hours if you experience unpleasant side effects which may include dry mouth, dizziness or visual disturbances. 3. Avoid touching the patch. Wash your hands with soap and water after contact with the patch.   Post Anesthesia Home Care Instructions  Activity: Get plenty of rest for the remainder of the day. A responsible individual must stay with you for 24 hours following the procedure.  For the next 24 hours, DO NOT: -Drive a car -Advertising copywriter -Drink alcoholic beverages -Take any medication unless instructed by your physician -Make any legal decisions or sign important papers.  Meals: Start with liquid foods such as gelatin or soup. Progress to regular foods as tolerated. Avoid greasy, spicy, heavy foods. If nausea and/or vomiting occur, drink only clear liquids until the nausea and/or vomiting subsides. Call your physician if vomiting continues.  Special Instructions/Symptoms: Your throat may feel dry or  sore from the anesthesia or the breathing tube placed in your throat during surgery. If this causes discomfort, gargle with warm salt water. The discomfort should disappear within 24 hours.  If you had a scopolamine  patch placed behind your ear for the management of post- operative nausea and/or vomiting:  1. The medication in the patch is effective for 72 hours, after which it should be removed.  Wrap patch in a tissue and discard in the trash. Wash hands thoroughly with soap and water. 2. You may remove the patch earlier than 72 hours if you experience unpleasant side effects which may include dry mouth, dizziness or visual disturbances. 3. Avoid touching the patch. Wash your hands with soap and water after contact with the patch.

## 2024-06-11 NOTE — H&P (Signed)
 ORTHOPAEDIC H&P  REQUESTING PHYSICIAN: Sharl Emily Dover, MD  PCP:  Emily Charm, MD  Chief Complaint: Right knee pain  HPI: [name] is a 43 y.o. female who complains of right knee pain and swelling.  Here today for arthroscopic assisted partial lateral meniscectomy.  No new complaints today.  Past Medical History:  Diagnosis Date   ADHD (attention deficit hyperactivity disorder)    Anxiety    Asthma    Allergy Induced Asthma   Bipolar 1 disorder (HCC)    Carpal tunnel syndrome on left    Depression    Diabetes mellitus without complication (HCC)    Type 2   Hypertension    Lumbar spinal stenosis    Sleep apnea    no cpap used   Past Surgical History:  Procedure Laterality Date   CARPAL TUNNEL RELEASE Left 07/14/2014   Procedure: LEFT CARPAL TUNNEL RELEASE;  Surgeon: Prentice LELON Pagan, MD;  Location: [facility];  Service: Orthopedics;  Laterality: Left;   LAPAROSCOPIC GASTRIC BANDING  2011   LAPAROSCOPIC GASTRIC SLEEVE RESECTION  2018   LUMBAR LAMINECTOMY/DECOMPRESSION MICRODISCECTOMY N/A 01/29/2017   Procedure: Central decompression, lumbar laminectomy L5-S1 with microdisectomy;  Surgeon: Heide Ingle, MD;  Location: [facility];  Service: Orthopedics;  Laterality: N/A;   LUMBAR LAMINECTOMY/DECOMPRESSION MICRODISCECTOMY Left 09/12/2023   Procedure: Left Lumbar Five-Sacral One Microdiscectomy;  Surgeon: Gillie Duncans, MD;  Location: [facility];  Service: Neurosurgery;  Laterality: Left;   WISDOM TOOTH EXTRACTION     Social History   Socioeconomic History   Marital status: Single    Spouse name: Not on file   Number of children: Not on file   Years of education: Not on file   Highest education level: Not on file  Occupational History   Not on file  Tobacco Use   Smoking status: Never   Smokeless tobacco: Never  Vaping Use   Vaping status: Never Used  Substance and Sexual Activity   Alcohol use: Not Currently   Drug use: No   Sexual  activity: Yes  Other Topics Concern   Not on file  Social History Narrative   Not on file   Social Drivers of Health   Financial Resource Strain: Not on file  Food Insecurity: Not on file  Transportation Needs: Not on file  Physical Activity: Not on file  Stress: Not on file  Social Connections: Not on file   Family History  Problem Relation Age of Onset   Asthma Mother    Diabetes Mother    Hypertension Mother    Hyperlipidemia Mother    CAD Father    Heart failure Father    Hypertension Father    Diabetes Father    Peripheral Artery Disease Father    Cancer Maternal Grandmother    Cervical cancer Maternal Grandmother    Diabetes Maternal Grandmother    Hypertension Maternal Grandmother    Hyperlipidemia Maternal Grandmother    Heart attack Maternal Grandfather    Kidney disease Maternal Grandfather    Hypertension Maternal Grandfather    Hyperlipidemia Maternal Grandfather    Diabetes Maternal Grandfather    Hypertension Paternal Grandmother    Diabetes Paternal Grandmother    Peripheral Artery Disease Paternal Grandfather    Diabetes Paternal Grandfather    Hypertension Paternal Grandfather    Hyperlipidemia Paternal Grandfather    No Known Allergies Prior to Admission medications   Medication Sig Start Date End Date Taking? Authorizing Provider  ALPRAZolam  (XANAX ) 0.5 MG  tablet Take 0.5 mg by mouth 3 (three) times daily as needed for anxiety.    Yes [provider]  cyclobenzaprine  (FLEXERIL ) 5 MG tablet Take 1 tablet (5 mg total) by mouth 3 (three) times daily as needed for muscle spasms. 09/13/23  Yes Johnanna Credit Caylin, PA-C  Esketamine HCl (SPRAVATO, 84 MG DOSE, NA) Place into the nose.   Yes [provider]  levothyroxine (SYNTHROID) 50 MCG tablet Take 50 mcg by mouth daily before breakfast.   Yes [provider]  lisdexamfetamine (VYVANSE ) 70 MG capsule Take 70 mg by mouth in the morning.   Yes [provider]   lisinopril (ZESTRIL) 5 MG tablet Take 5 mg by mouth daily.   Yes [provider]  rosuvastatin  (CRESTOR ) 5 MG tablet Take 5 mg by mouth every evening.   Yes [provider]  albuterol  (VENTOLIN  HFA) 108 (90 Base) MCG/ACT inhaler Inhale 1-2 puffs into the lungs every 6 (six) hours as needed for wheezing or shortness of breath.    [provider]  docusate sodium  (COLACE) 100 MG capsule Take 1 capsule (100 mg total) by mouth 2 (two) times daily. 09/13/23 09/12/24  Johnanna Credit Caylin, PA-C  EPINEPHrine  0.3 mg/0.3 mL IJ SOAJ injection Inject 0.3 mg into the muscle as needed for anaphylaxis.    [provider]  oxyCODONE  (OXY IR/ROXICODONE ) 5 MG immediate release tablet Take 1 tablet (5 mg total) by mouth every 4 (four) hours as needed for moderate pain (pain score 4-6). 09/13/23   Johnanna Credit Caylin, PA-C  OZEMPIC, 1 MG/DOSE, 4 MG/3ML SOPN 1 mg by Subconjunctival route every Tuesday.    [provider]   No results found.  Positive ROS: All other systems have been reviewed and were otherwise negative with the exception of those mentioned in the HPI and as above.  Physical Exam: General: Alert, no acute distress Cardiovascular: No pedal edema Respiratory: No cyanosis, no use of accessory musculature GI: No organomegaly, abdomen is soft and non-tender Skin: No lesions in the area of chief complaint Neurologic: Sensation intact distally Psychiatric: Patient is competent for consent with normal mood and affect Lymphatic: No axillary or cervical lymphadenopathy  MUSCULOSKELETAL: Right lower extremity is warm and well-perfused with no open wounds or lesions.  Assessment: Right knee lateral meniscus tear, acute.  Plan: Plan to proceed today with arthroscopic assisted right knee partial lateral meniscectomy.  We again discussed the risk and benefits of the procedure which include but are not limited to bleeding, infection, damage to surrounding nerves  and vessels, stiffness, persistent pain, Informed consent obtained.  Plan for discharge home postop from PACU.    Emily Belvie Gosling, MD Cell [phone]    06/11/2024 9:58 AM

## 2024-06-11 NOTE — Anesthesia Procedure Notes (Signed)
 Procedure Name: LMA Insertion Date/Time: 06/11/2024 10:37 AM  Performed by: Donnell Berwyn SQUIBB, CRNAPre-anesthesia Checklist: Patient identified, Emergency Drugs available, Suction available, Patient being monitored and Timeout performed Patient Re-evaluated:Patient Re-evaluated prior to induction Oxygen Delivery Method: Circle system utilized Preoxygenation: Pre-oxygenation with 100% oxygen Induction Type: IV induction Ventilation: Mask ventilation without difficulty LMA: LMA inserted LMA Size: 4.0 Placement Confirmation: positive ETCO2 and breath sounds checked- equal and bilateral Tube secured with: Tape Dental Injury: Teeth and Oropharynx as per pre-operative assessment

## 2024-06-11 NOTE — Anesthesia Postprocedure Evaluation (Signed)
 Anesthesia Post Note  Patient: [name]  Procedure(s) Performed: ARTHROSCOPY, KNEE, WITH LATERAL MENISCECTOMY (Right: Knee)     Patient location during evaluation: PACU Anesthesia Type: General Level of consciousness: awake and alert Pain management: pain level controlled Vital Signs Assessment: post-procedure vital signs reviewed and stable Respiratory status: spontaneous breathing, nonlabored ventilation and respiratory function stable Cardiovascular status: blood pressure returned to baseline and stable Postop Assessment: no apparent nausea or vomiting Anesthetic complications: no   No notable events documented.  Last Vitals:  Vitals:   06/11/24 1200 06/11/24 1215  BP: (!) 124/96 127/81  Pulse: 80 73  Resp: 15 16  Temp:  (!) 36.3 C  SpO2: 100% 98%    Last Pain:  Vitals:   06/11/24 1215  TempSrc:   PainSc: 5                  Garnette FORBES Skillern

## 2024-06-11 NOTE — Transfer of Care (Signed)
 Immediate Anesthesia Transfer of Care Note  Patient: [name]  Procedure(s) Performed: ARTHROSCOPY, KNEE, WITH LATERAL MENISCECTOMY (Right: Knee)  Patient Location: PACU  Anesthesia Type:General  Level of Consciousness: awake, alert , and patient cooperative  Airway & Oxygen Therapy: Patient Spontanous Breathing and Patient connected to nasal cannula oxygen  Post-op Assessment: Report given to RN and Post -op Vital signs reviewed and stable  Post vital signs: Reviewed and stable  Last Vitals:  Vitals Value Taken Time  BP 122/86 06/11/24 11:22  Temp    Pulse 86 06/11/24 11:24  Resp 16 06/11/24 11:24  SpO2 100 % 06/11/24 11:24  Vitals shown include unfiled device data.  Last Pain:  Vitals:   06/11/24 0958  TempSrc: Temporal  PainSc: 5       Patients Stated Pain Goal: 4 (06/11/24 0958)  Complications: No notable events documented.

## 2024-06-12 ENCOUNTER — Encounter (HOSPITAL_BASED_OUTPATIENT_CLINIC_OR_DEPARTMENT_OTHER): Payer: Self-pay | Admitting: Orthopedic Surgery
